# Patient Record
Sex: Male | Born: 1950
Health system: Southern US, Community
[De-identification: ages and names within clinical notes are randomized; demographics above are authoritative.]

## PROBLEM LIST (undated history)

## (undated) DIAGNOSIS — J029 Acute pharyngitis, unspecified: Secondary | ICD-10-CM

## (undated) DIAGNOSIS — J40 Bronchitis, not specified as acute or chronic: Secondary | ICD-10-CM

## (undated) DIAGNOSIS — F329 Major depressive disorder, single episode, unspecified: Secondary | ICD-10-CM

## (undated) DIAGNOSIS — J329 Chronic sinusitis, unspecified: Secondary | ICD-10-CM

## (undated) DIAGNOSIS — J069 Acute upper respiratory infection, unspecified: Secondary | ICD-10-CM

## (undated) DIAGNOSIS — R21 Rash and other nonspecific skin eruption: Secondary | ICD-10-CM

## (undated) DIAGNOSIS — K5790 Diverticulosis of intestine, part unspecified, without perforation or abscess without bleeding: Secondary | ICD-10-CM

## (undated) DIAGNOSIS — E875 Hyperkalemia: Secondary | ICD-10-CM

## (undated) DIAGNOSIS — J310 Chronic rhinitis: Secondary | ICD-10-CM

## (undated) DIAGNOSIS — D492 Neoplasm of unspecified behavior of bone, soft tissue, and skin: Secondary | ICD-10-CM

## (undated) DIAGNOSIS — R109 Unspecified abdominal pain: Secondary | ICD-10-CM

## (undated) DIAGNOSIS — Z6827 Body mass index (BMI) 27.0-27.9, adult: Secondary | ICD-10-CM

## (undated) DIAGNOSIS — L300 Nummular dermatitis: Secondary | ICD-10-CM

## (undated) DIAGNOSIS — E785 Hyperlipidemia, unspecified: Secondary | ICD-10-CM

## (undated) DIAGNOSIS — T7840XA Allergy, unspecified, initial encounter: Secondary | ICD-10-CM

## (undated) DIAGNOSIS — I10 Essential (primary) hypertension: Secondary | ICD-10-CM

## (undated) DIAGNOSIS — A084 Viral intestinal infection, unspecified: Secondary | ICD-10-CM

## (undated) DIAGNOSIS — L409 Psoriasis, unspecified: Secondary | ICD-10-CM

## (undated) DIAGNOSIS — I2510 Atherosclerotic heart disease of native coronary artery without angina pectoris: Secondary | ICD-10-CM

## (undated) DIAGNOSIS — R001 Bradycardia, unspecified: Secondary | ICD-10-CM

## (undated) DIAGNOSIS — E781 Pure hyperglyceridemia: Secondary | ICD-10-CM

## (undated) DIAGNOSIS — J309 Allergic rhinitis, unspecified: Secondary | ICD-10-CM

## (undated) DIAGNOSIS — L42 Pityriasis rosea: Secondary | ICD-10-CM

## (undated) DIAGNOSIS — K921 Melena: Secondary | ICD-10-CM

## (undated) DIAGNOSIS — F419 Anxiety disorder, unspecified: Secondary | ICD-10-CM

## (undated) DIAGNOSIS — E7800 Pure hypercholesterolemia, unspecified: Secondary | ICD-10-CM

## (undated) DIAGNOSIS — R5383 Other fatigue: Secondary | ICD-10-CM

## (undated) DIAGNOSIS — F32A Depression, unspecified: Secondary | ICD-10-CM

## (undated) DIAGNOSIS — K219 Gastro-esophageal reflux disease without esophagitis: Secondary | ICD-10-CM

## (undated) DIAGNOSIS — M1990 Unspecified osteoarthritis, unspecified site: Secondary | ICD-10-CM

## (undated) DIAGNOSIS — B356 Tinea cruris: Secondary | ICD-10-CM

## (undated) DIAGNOSIS — B379 Candidiasis, unspecified: Secondary | ICD-10-CM

## (undated) DIAGNOSIS — R42 Dizziness and giddiness: Secondary | ICD-10-CM

## (undated) DIAGNOSIS — B079 Viral wart, unspecified: Secondary | ICD-10-CM

## (undated) DIAGNOSIS — J189 Pneumonia, unspecified organism: Secondary | ICD-10-CM

## (undated) HISTORY — DX: Pure hyperglyceridemia: E78.1

## (undated) HISTORY — DX: Gastro-esophageal reflux disease without esophagitis: K21.9

## (undated) HISTORY — DX: Anxiety disorder, unspecified: F41.9

## (undated) HISTORY — PX: APPENDECTOMY: SHX54

## (undated) HISTORY — DX: Unspecified osteoarthritis, unspecified site: M19.90

## (undated) HISTORY — DX: Diverticulosis of intestine, part unspecified, without perforation or abscess without bleeding: K57.90

## (undated) HISTORY — DX: Dizziness and giddiness: R42

## (undated) HISTORY — DX: Viral intestinal infection, unspecified: A08.4

## (undated) HISTORY — DX: Unspecified abdominal pain: R10.9

## (undated) HISTORY — PX: COLONOSCOPY: SHX174

## (undated) HISTORY — DX: Viral wart, unspecified: B07.9

## (undated) HISTORY — DX: Hyperkalemia: E87.5

## (undated) HISTORY — DX: Other fatigue: R53.83

## (undated) HISTORY — DX: Tinea cruris: B35.6

## (undated) HISTORY — DX: Candidiasis, unspecified: B37.9

## (undated) HISTORY — DX: Psoriasis, unspecified: L40.9

## (undated) HISTORY — DX: Essential (primary) hypertension: I10

## (undated) HISTORY — DX: Depression, unspecified: F32.A

## (undated) HISTORY — DX: Acute upper respiratory infection, unspecified: J06.9

## (undated) HISTORY — DX: Nummular dermatitis: L30.0

## (undated) HISTORY — DX: Chronic sinusitis, unspecified: J32.9

## (undated) HISTORY — DX: Major depressive disorder, single episode, unspecified: F32.9

## (undated) HISTORY — PX: PENILE PROSTHESIS PLACEMENT: SHX739

## (undated) HISTORY — PX: NASAL SINUS SURGERY: SHX719

## (undated) HISTORY — PX: HERNIA REPAIR: SHX51

## (undated) HISTORY — DX: Body mass index (BMI) 27.0-27.9, adult: Z68.27

## (undated) HISTORY — DX: Pure hypercholesterolemia, unspecified: E78.00

## (undated) HISTORY — PX: EYE SURGERY: SHX253

## (undated) HISTORY — DX: Rash and other nonspecific skin eruption: R21

## (undated) HISTORY — DX: Acute pharyngitis, unspecified: J02.9

## (undated) HISTORY — DX: Allergic rhinitis, unspecified: J30.9

## (undated) HISTORY — DX: Atherosclerotic heart disease of native coronary artery without angina pectoris: I25.10

## (undated) HISTORY — DX: Neoplasm of unspecified behavior of bone, soft tissue, and skin: D49.2

## (undated) HISTORY — DX: Pityriasis rosea: L42

## (undated) HISTORY — DX: Bradycardia, unspecified: R00.1

## (undated) HISTORY — DX: Chronic rhinitis: J31.0

## (undated) HISTORY — PX: CARDIAC CATHETERIZATION: SHX172

## (undated) HISTORY — DX: Melena: K92.1

## (undated) HISTORY — DX: Hyperlipidemia, unspecified: E78.5

## (undated) HISTORY — DX: Bronchitis, not specified as acute or chronic: J40

## (undated) HISTORY — PX: ESOPHAGOGASTRODUODENOSCOPY: SHX1529

---

## 2004-09-11 DIAGNOSIS — K5790 Diverticulosis of intestine, part unspecified, without perforation or abscess without bleeding: Secondary | ICD-10-CM

## 2004-09-11 HISTORY — DX: Diverticulosis of intestine, part unspecified, without perforation or abscess without bleeding: K57.90

## 2007-09-24 ENCOUNTER — Ambulatory Visit: Payer: Self-pay | Admitting: Cardiology

## 2007-09-25 ENCOUNTER — Ambulatory Visit: Payer: Self-pay | Admitting: Cardiology

## 2007-09-25 ENCOUNTER — Inpatient Hospital Stay (HOSPITAL_COMMUNITY): Admission: AD | Admit: 2007-09-25 | Discharge: 2007-09-26 | Payer: Self-pay | Admitting: Cardiology

## 2007-09-26 ENCOUNTER — Encounter: Payer: Self-pay | Admitting: Cardiology

## 2007-10-09 ENCOUNTER — Ambulatory Visit: Payer: Self-pay | Admitting: Cardiology

## 2007-12-03 ENCOUNTER — Encounter: Payer: Self-pay | Admitting: Cardiology

## 2008-09-14 ENCOUNTER — Encounter: Payer: Self-pay | Admitting: Cardiology

## 2008-10-23 ENCOUNTER — Ambulatory Visit: Payer: Self-pay | Admitting: Cardiology

## 2008-12-07 ENCOUNTER — Encounter: Payer: Self-pay | Admitting: Cardiology

## 2008-12-10 ENCOUNTER — Encounter: Payer: Self-pay | Admitting: Cardiology

## 2009-06-23 DIAGNOSIS — E785 Hyperlipidemia, unspecified: Secondary | ICD-10-CM | POA: Insufficient documentation

## 2009-06-23 DIAGNOSIS — E781 Pure hyperglyceridemia: Secondary | ICD-10-CM

## 2009-06-23 DIAGNOSIS — I498 Other specified cardiac arrhythmias: Secondary | ICD-10-CM

## 2009-06-23 DIAGNOSIS — I10 Essential (primary) hypertension: Secondary | ICD-10-CM

## 2009-12-01 ENCOUNTER — Encounter: Payer: Self-pay | Admitting: Cardiology

## 2009-12-02 ENCOUNTER — Encounter: Payer: Self-pay | Admitting: Cardiology

## 2010-02-11 ENCOUNTER — Encounter: Payer: Self-pay | Admitting: Cardiology

## 2010-02-14 ENCOUNTER — Ambulatory Visit: Payer: Self-pay | Admitting: Cardiology

## 2010-02-22 ENCOUNTER — Encounter: Payer: Self-pay | Admitting: Cardiology

## 2010-02-23 ENCOUNTER — Encounter (INDEPENDENT_AMBULATORY_CARE_PROVIDER_SITE_OTHER): Payer: Self-pay | Admitting: *Deleted

## 2010-10-11 NOTE — Letter (Signed)
Summary: Appointment -missed  [facility] [address]   [place], [place] [address]   Phone: [phone]  Fax: [phone]     December 02, 2009 MRN: [id number]     Clayton Braun [address] [place], [place]  [address]     Dear Clayton Braun,  Our records indicate you missed your appointment on December 02, 2009                        with Dr. Myrtis Ser.   It is very important that we reach you to reschedule this appointment. We look forward to participating in your health care needs.   Please contact us at the number listed above at your earliest convenience to reschedule this appointment.   Sincerely,    Glass blower/designer

## 2010-10-11 NOTE — Letter (Signed)
Summary: Engineer, materials at [facility]  [address]   [place], [place] [address]   Phone: [phone]  Fax: [phone]        February 23, 2010 MRN: [id number]    Clayton Braun [address] [place], [place]  [address]    Dear Mr. SENNETT,  Your test ordered by Selena Batten has been reviewed by your physician (or physician assistant) and was found to be normal or stable. Your physician (or physician assistant) felt no changes were needed at this time.  ____ Echocardiogram  ____ Cardiac Stress Test  __X__ Lab Work-Continue current medication regimen. Per Dr. Myrtis Ser, "much better."  ____ Peripheral vascular study of arms, legs or neck  ____ CT scan or X-ray  ____ Lung or Breathing test  ____ Other:   Thank you.   Cyril Loosen, RN, BSN    Duane Boston, M.D., F.A.C.C. Thressa Sheller, M.D., F.A.C.C. Oneal Grout, M.D., F.A.C.C. Cheree Ditto, M.D., F.A.C.C. Daiva Nakayama, M.D., F.A.C.C. Kenney Houseman, M.D., F.A.C.C. Jeanne Ivan, PA-C

## 2010-10-11 NOTE — Miscellaneous (Signed)
  Clinical Lists Changes  Observations: Added new observation of PAST MED HX: CAD...stent RCA..2002...  /  cath  2009...stent patent...residual 50% LAD HYPERTRIGLYCERIDEMIA (ICD-272.1) BRADYCARDIA (ICD-427.89)...sinus HYPERTENSION HYPERLIPIDEMIA depression (02/11/2010 18:13) Added new observation of PRIMARY MD: Vyas (02/11/2010 18:13)       Past History:  Past Medical History: CAD...stent RCA..2002...  /  cath  2009...stent patent...residual 50% LAD HYPERTRIGLYCERIDEMIA (ICD-272.1) BRADYCARDIA (ICD-427.89)...sinus HYPERTENSION HYPERLIPIDEMIA depression

## 2010-10-11 NOTE — Assessment & Plan Note (Signed)
Summary: 1 yr fu-missed last appt.vs   Visit Type:  Follow-up Primary Provider:  Dhruv Vyas,MD  CC:  CAD.  History of Present Illness: Patient is seen for followup of coronary artery disease.  I saw him last February, 2010.  Is not having any chest pain.  He had a stent to his right coronary artery in 2002.  Catheterization 2009 showed that the stent was patent and there was a residual 50% LAD lesion.  He is not eat red meat.  His weight is under good control.  He recently had some labs that show that his triglycerides are elevated despite his meds.  Historically his meds have controlled the triglycerides.  After further discussion he relates that he had had a fishing weekend just prior to the labs and had consumed a large amount of alcohol.  Regular alcohol use is not a problem but it probably infected these labs.  Preventive Screening-Counseling & Management  Alcohol-Tobacco     Smoking Status: quit     Year Quit: 12/2009  Current Medications (verified): 1)  Alprazolam 0.25 Mg Tabs (Alprazolam) .... Take 1 Tablet By Mouth Once A Day 2)  Aspirin Low Strength 81 Mg Chew (Aspirin) .... Take 1 Tablet By Mouth Once A Day 3)  Atenolol 25 Mg Tabs (Atenolol) .... Take 1 Tablet By Mouth Once A Day 4)  Gemfibrozil 600 Mg Tabs (Gemfibrozil) .... Take 1 Tablet By Mouth Twice A Day 5)  Lipitor 10 Mg Tabs (Atorvastatin Calcium) .... Take 1 Tablet By Mouth At Bedtime 6)  Nexium 40 Mg Cpdr (Esomeprazole Magnesium) .... Take 1 Capsule By Mouth Once A Day 7)  Paroxetine Hcl 20 Mg Tabs (Paroxetine Hcl) .... Take 1 Tablet By Mouth Once A Day 8)  Ramipril 2.5 Mg Caps (Ramipril) .... Take 1 Capsule By Mouth Once A Day  Allergies (verified): No Known Drug Allergies  Comments:  Nurse/Medical Assistant: The patient is currently on medications but does not know the name or dosage at this time. Instructed to contact our office with details. Will update medication list at that time.  Past History:  Past  Medical History: CAD...stent RCA..2002...  /  cath  2009...stent patent...residual 50% LAD HYPERTRIGLYCERIDEMIA (ICD-272.1) BRADYCARDIA (ICD-427.89)...sinus HYPERTENSION HYPERLIPIDEMIA depression..  Social History: Smoking Status:  quit  Review of Systems       Patient denies fever, chills, headache, sweats, rash, change in vision, change in hearing, chest pain, cough, nausea vomiting, urinary symptoms.  All other systems are reviewed and are negative.  Vital Signs:  Patient profile:   60 year old male Height:      66 inches Weight:      161 pounds BMI:     26.08 Pulse rate:   52 / minute BP sitting:   101 / 67  (left arm) Cuff size:   regular  Vitals Entered By: Carlye Grippe (February 14, 2010 10:59 AM)  Nutrition Counseling: Patient's BMI is greater than 25 and therefore counseled on weight management options.  Physical Exam  General:  patient is stable in general. Head:  head is atraumatic. Eyes:  no xanthelasma. Neck:  no jugular venous distention.  No carotid bruits. Chest Wall:  no chest wall tenderness. Lungs:  lungs are clear.  Respiratory effort is nonlabored. Heart:  cardiac exam reveals S1-S2.  No clicks or significant murmurs. Abdomen:  abdomen soft. Msk:  no musculoskeletal deformities. Extremities:  no peripheral edema. Skin:  no skin rashes. Psych:  patient is oriented to person time and  place.  Affect is normal.   Impression & Recommendations:  Problem # 1:  CAD, NATIVE VESSEL (ICD-414.01)  His updated medication list for this problem includes:    Aspirin Low Strength 81 Mg Chew (Aspirin) .Marland Kitchen... Take 1 tablet by mouth once a day    Atenolol 25 Mg Tabs (Atenolol) .Marland Kitchen... Take 1 tablet by mouth once a day    Ramipril 2.5 Mg Caps (Ramipril) .Marland Kitchen... Take 1 capsule by mouth once a day  Orders: EKG w/ Interpretation (93000) Coronary disease is stable.  EKG is done today and reviewed by me.  He has sinus bradycardia.  No change in therapy.  Problem # 2:   HYPERTRIGLYCERIDEMIA (ICD-272.1)  His updated medication list for this problem includes:    Gemfibrozil 600 Mg Tabs (Gemfibrozil) .Marland Kitchen... Take 1 tablet by mouth twice a day    Lipitor 10 Mg Tabs (Atorvastatin calcium) .Marland Kitchen... Take 1 tablet by mouth at bedtime I have looked back at his labs over the years.  On his medications his triglycerides are usually controlled.  Most recent labs showed that his triglycerides were elevated.  After that review it became clear that he had had heavy alcohol intake on a fishing trip the weekend before the labs.  It does not appear that alcohol is an ongoing issue but it didn't affect his labs.  Repeat fasting lipid profile was appropriate to be sure.  Problem # 3:  HYPERTENSION, UNSPECIFIED (ICD-401.9)  His updated medication list for this problem includes:    Aspirin Low Strength 81 Mg Chew (Aspirin) .Marland Kitchen... Take 1 tablet by mouth once a day    Atenolol 25 Mg Tabs (Atenolol) .Marland Kitchen... Take 1 tablet by mouth once a day    Ramipril 2.5 Mg Caps (Ramipril) .Marland Kitchen... Take 1 capsule by mouth once a day Blood pressure is controlled.  No change in therapy.  Problem # 4:  BRADYCARDIA (ICD-427.89)  His updated medication list for this problem includes:    Aspirin Low Strength 81 Mg Chew (Aspirin) .Marland Kitchen... Take 1 tablet by mouth once a day    Atenolol 25 Mg Tabs (Atenolol) .Marland Kitchen... Take 1 tablet by mouth once a day    Ramipril 2.5 Mg Caps (Ramipril) .Marland Kitchen... Take 1 capsule by mouth once a day Patient has bradycardia but has no symptoms.  No change in therapy.  Other Orders: T-Lipid Profile [phone]) T-Hepatic Function [phone])  Patient Instructions: 1)  Your physician wants you to follow-up in: 1 year. You will receive a reminder letter in the mail one-two months in advance. If you don't receive a letter, please call our office to schedule the follow-up appointment. 2)  Your physician recommends that you continue on your current medications as directed. Please refer to the  Current Medication list given to you today. 3)  Your physician recommends that you go to the [facility] for a FASTING lipid profile and liver function labs. Do not eat or drink after midnight. If the results of your test are normal or stable, you will receive a letter. If they are abnormal, the nurse will contact you by phone.

## 2010-10-11 NOTE — Miscellaneous (Signed)
  Clinical Lists Changes  Observations: Added new observation of PAST MED HX: CAD...stent RCA..2002...  /  cath  2009...stent patent...residual 50% LAD HYPERTRIGLYCERIDEMIA (ICD-272.1) BRADYCARDIA (ICD-427.89)...sinus HYPERTENSION, UNSPECIFIED (ICD-401.9) HYPERLIPIDEMIA-MIXED (ICD-272.4) depression (12/01/2009 12:15) Added new observation of PRIMARY MD: Vyas (12/01/2009 12:15)       Past History:  Past Medical History: CAD...stent RCA..2002...  /  cath  2009...stent patent...residual 50% LAD HYPERTRIGLYCERIDEMIA (ICD-272.1) BRADYCARDIA (ICD-427.89)...sinus HYPERTENSION, UNSPECIFIED (ICD-401.9) HYPERLIPIDEMIA-MIXED (ICD-272.4) depression

## 2011-01-24 NOTE — Assessment & Plan Note (Signed)
[facility]                          [facility] OFFICE NOTE   ISIDORE, MARGRAF                       MRN:          [id number]  DATE:10/23/2008                            DOB:          [birth year]    Mr. Comer is seen for the followup of his coronary artery disease.  He  is actually doing well.  He has known disease.  His last cath was done  in January 2009.  Stent to his right coronary artery was patent.  D-  dimer was normal.  He has had no significant chest discomfort.  He is  taking his medicines.   PAST MEDICAL HISTORY:   ALLERGIES:  No known drug allergies.   MEDICATIONS:  See the flow sheet.   REVIEW OF SYSTEMS:  He has no GI or GU symptoms.  He has no headaches.  There is no fevers or chills.  His review of systems is negative.   PHYSICAL EXAMINATION:  VITAL SIGNS:  Blood pressure is 110/70 with a  pulse of 50.  GENERAL:  The patient is oriented to person, time, and place.  Affect is  normal.  HEENT:  No xanthelasma.  He has normal extraocular motion.  NECK:  There are no carotid bruits.  There is no jugular venous  distention.  LUNGS:  Clear.  Respiratory effort is not labored.  CARDIAC:  S1 with an S2.  There are no clicks or significant murmurs.  ABDOMEN:  Soft.  EXTREMITIES:  He has no peripheral edema.   EKG reveals no significant abnormalities.   PROBLEMS:  #1.  Hypertension treated.  #2.  Hyperlipidemia.  LDL recently was 113.  HDL was up to 46.  Triglycerides were down to 118.  I have suggested that his Lipitor will  be increased to 20 mg daily.  His LDL goal should be in the range of 70.  #3.  Chronic sinus bradycardia, stated stable.  #4.  History of tobacco use and he has stopped completely.  #5.  History of depression, stable.  #6.  Hypertriglyceridemia.  This is now nicely treated.  #7.  Status post appendectomy and hernia repair.  #8.  Coronary artery disease.  He had a stent to his right coronary  artery in  2002.  Catheterization in 2009 showed 50% left anterior  descending residual stenosis and a patent  stent in his right coronary artery.  He is stable.  He needs no further  workup.  I will see him back in a year.  He will check with Dr. Sherril Croon  about pushing his Lipitor up to 20.     Luis Abed, MD, [facility]  Electronically Signed    JDK/MedQ  DD: 10/23/2008  DT: 10/24/2008  Job #: [id number]   cc:   Doreen Beam, MD

## 2011-01-24 NOTE — Assessment & Plan Note (Signed)
[facility]                          [facility] OFFICE NOTE   [name], [name]                       MRN:          [id number]  DATE:10/09/2007                            DOB:          1950-10-19    Clayton Braun is seen for post hospital follow-up.  He had been seen at  [facility] and because he had proven coronary disease before and he had  presenting symptoms.  He was sent to [facility] for follow-up cath.  The study  was done by Dr. Juanda Chance.  The cath was done September 26, 2007.  He had a  50% LAD lesion.  The stent in his right coronary artery had only 20%  stenosis.  D-dimer was normal.  It was felt that his symptoms were not  cardiac and not related to pulmonary embolism.  He was discharged home  and he is doing well.  He is increase his activities back to normal.   PAST MEDICAL HISTORY:   ALLERGIES:  No known drug allergies.   MEDICATIONS:  Gemfibrozil, Lipitor, alprazolam, paroxetine,  multivitamin, aspirin, atenolol, Nexium, and ramipril.   OTHER MEDICAL PROBLEMS:  See the list below.   REVIEW OF SYSTEMS:  He is feeling well today.  He is not having any  significant problems.   His review of systems otherwise is negative.   PHYSICAL EXAM:  Today he is quite stable.  Blood pressure is 116/67 with a pulse of 76.  The patient is oriented to person, time and place in his affect is  normal.  HEENT:  Reveals no xanthelasma.  He has normal extraocular motion.  There are no carotid bruits.  There is no jugular venous distention.  Lungs are clear.  Respiratory effort is not labored.  Cardiac exam reveals S1-S2.  There are no clicks or significant murmurs.  His abdomen is soft.  His cath site is nicely healed.  He has no peripheral edema.   EKG reviewed is normal.   PROBLEMS:  1. Hypertension treated.  2. Hyperlipidemia treated.  It is important that his LDL be kept in      the 70 range.  3. Chronic sinus bradycardia.  4. Ongoing tobacco  abuse and we have continued to educate him and      encourage him to stop completely.  5. History of some depression.  6. Hypertriglyceridemia on medication.  7. Status post appendectomy and hernia repair.  8. Coronary disease post PCI with a stent to the right coronary artery      in 2002 and recent cath as outlined above with residual 50%      stenosis at this time of his LAD.  His right coronary stent is      stable.   Clayton Braun is doing well.  I had a good discussion with him about taking  his medications and careful follow-up.  I will see him back in 1 year.  If he has any return of significant symptoms will be happy to see him  sooner.  He is stable.     Clayton Abed, MD, [facility]  Electronically  Signed    JDK/MedQ  DD: 10/09/2007  DT: 10/09/2007  Job #: [id number]   cc:   Clayton Beam, MD

## 2011-01-24 NOTE — Discharge Summary (Signed)
NAMEJERMARION, Clayton Braun                ACCOUNT NO.:  [id number]   MEDICAL RECORD NO.:  [id number]          PATIENT TYPE:  INP   LOCATION:  2021                         FACILITY:  [facility]   PHYSICIAN:  Everardo Beals. Juanda Chance, MD, FACCDATE OF BIRTH:  June 21, 1951   DATE OF ADMISSION:  09/25/2007  DATE OF DISCHARGE:  09/26/2007                               DISCHARGE SUMMARY   PRIMARY CARDIOLOGIST:  Luis Abed, MD, [facility] in [place].   PRIMARY CARE PHYSICIAN:  Doreen Beam, M.D. also in [place].   DISCHARGE DIAGNOSIS:  Chest pain.   SECONDARY DIAGNOSES:  1. Coronary artery disease, status post percutaneous coronary      intervention and bare metal stenting of the right coronary artery      in 2002.  2. Hypertension.  3. Hyperlipidemia.  4. Chronic sinus bradycardia.  5. Ongoing tobacco abuse at a pack a day.  6. Gastroesophageal reflux disease.  7. Depression.  8. Hypertriglyceridemia.  9. Status post appendectomy.  10.Status post hernia repair.   ALLERGIES:  No known drug allergies.   PROCEDURES:  Left heart cardiac catheterization.   HISTORY OF PRESENT ILLNESS:  A 60 year old male with a prior history of  single vessel CAD status post bare metal stenting of the right coronary  artery at [facility] in [place] in 2002.  He  was directly admitted to [facility] on September 24, 2007,  secondary to a several week history of a mid chest burning sensation as  well as episodic hot flashes without any frank chest pain, tightness, or  dyspnea.  The symptoms he presented with however were reminiscent of  prior angina thus prompting admission.  He was evaluated by [facility]iology at [facility] and the decision was made to transfer him to  [facility] for further evaluation.   HOSPITAL COURSE:  [name] ruled out for MI.  He underwent left heart  cardiac catheterization on January 15th, revealing a patent stent in the  proximal RCA and otherwise nonobstructive  disease.  EF was 60% without  regional wall motion abnormalities.  We will plan to discharge the  patient today as he has been ambulating without recurrent discomfort.  We have increased his Nexium to 40 mg b.i.d.   DISCHARGE LABORATORY:  Total cholesterol 117, triglycerides 191, HDL 24,  LDL 55.  Of note cardiac markers were negative at [facility].   DISPOSITION:  The patient is being discharged home today in good  condition.   FOLLOWUP PLANS/APPOINTMENTS:  1. We arranged for followup with Dr. Willa Rough on January 28th at      12:30 p.m.  2. He is to follow up with Dr. Sherril Croon as scheduled.   DISCHARGE MEDICATIONS:  1. Lopid 600 mg b.i.d.  2. B12 vitamin b.i.d.  3. Altace 2.5 mg daily.  4. Atenolol 25 mg daily.  5. Aspirin 81 mg daily.  6. Lipitor 10 mg daily.  7. Paxil 20 mg daily.  8. Foltx tab 1 daily.  9. Xanax 0.5 mg q.h.s.  10.Nexium 40 mg b.i.d.  11.Nitroglycerin 0.4 mg sublingual p.r.n. chest  pain.   OUTSTANDING LABORATORY STUDIES:  None.   DURATION OF DISCHARGE ENCOUNTER:  Forty minutes including physician  time.      Nicolasa Ducking, ANP      Bruce R. Juanda Chance, MD, [facility]  Electronically Signed    CB/MEDQ  D:  09/26/2007  T:  09/26/2007  Job:  [id number]   cc:   Doreen Beam, MD

## 2011-01-24 NOTE — Cardiovascular Report (Signed)
Clayton Braun, Clayton Braun                ACCOUNT NO.:  [id number]   MEDICAL RECORD NO.:  [id number]          PATIENT TYPE:  INP   LOCATION:  2021                         FACILITY:  [facility]   PHYSICIAN:  Everardo Beals. Juanda Chance, MD, FACCDATE OF BIRTH:  June 22, 1951   DATE OF PROCEDURE:  09/26/2007  DATE OF DISCHARGE:                            CARDIAC CATHETERIZATION   PRIMARY CARE PHYSICIAN:  Dr. Lillie Columbia.   PRIMARY CARDIOLOGIST:  Dr. Myrtis Ser in [place].   CLINICAL HISTORY:  The patient is a 60 year old and has a history of  bare metal stent placed in 2002 by Dr. Dorena Cookey at [facility].  He  developed chest pain yesterday and was seen in Dr. Reino Bellis and  transferred to Dr. Andee Lineman and transferred to [facility] was seen  by Dr. Andee Lineman again.  His symptoms were felt consistent with unstable  angina and was transferred to [facility] for further evaluation.  His  markers were negative.  He continues to smoke.  Does have a history of  reflux disease.   PROCEDURE:  Procedure was performed via the femoral  artery using an  arterial sheath and 5-French preformed coronary catheters. A front wall  arterial puncture was performed and Omnipaque contrast was used.  The  patient tolerated the procedure well and left the laboratory in  satisfactory condition.   RESULTS:  Left main coronary artery:  The left main coronary  artery is  free of significant disease.   Left anterior descending artery:  Left anterior descending artery gave  rise to 3 diagonal branches, septal perforator.  The LAD was small in  caliber and irregular.  There was 50% narrowing in the proximal to mid-  vessel.   Circumflex artery:  The circumflex artery gave rise to a marginal branch  and a posterolateral branch.  This vessel is also small and irregular  but free of major obstruction.   The right coronary artery was a moderate-sized vessel that gave rise to  posterior descending branch and 2 posterolateral branches.  There  is a  focal 20% narrowing within the stent in the very proximal portion of the  right coronary artery.  There are irregularities in the rest of the  vessel but no significant obstruction.   The left ventriculogram performed in the RAO projection showed good wall  motion with no areas of hypokinesis.  The estimated fraction is 60%.   Aortic pressure was 108/65 with a mean of 83 and left ventricle pressure  108/14.   CONCLUSION:  Nonobstructive coronary artery disease with 50% narrowing  in the proximal LAD, no significant obstruction of circumflex artery,  20% narrowing at the stent in the proximal right coronary and normal LV  function.   RECOMMENDATIONS:  Reassurance.  In view of these findings, his symptoms  may be related to reflux.  Will plan to get a D-dimer and to rule out  the remote possibility of pulmonary embolism.  If this is negative we  will treat him for reflux and see if there is recurrence of symptoms.  Will plan discharge later in the day.  Bruce Elvera Lennox Juanda Chance, MD, [facility]  Electronically Signed     BRB/MEDQ  D:  09/26/2007  T:  09/26/2007  Job:  [id number]   cc:   Doreen Beam, MD

## 2011-03-16 ENCOUNTER — Encounter: Payer: Self-pay | Admitting: Cardiology

## 2011-06-01 LAB — D-DIMER, QUANTITATIVE: D-Dimer, Quant: <0.22

## 2011-06-01 LAB — LIPID PANEL
Cholesterol: 117
LDL Cholesterol: 55
Total CHOL/HDL Ratio: 4.9
VLDL: 38

## 2011-12-06 ENCOUNTER — Encounter: Payer: Self-pay | Admitting: Cardiology

## 2011-12-06 DIAGNOSIS — I2510 Atherosclerotic heart disease of native coronary artery without angina pectoris: Secondary | ICD-10-CM | POA: Insufficient documentation

## 2011-12-07 ENCOUNTER — Encounter: Payer: Self-pay | Admitting: Cardiology

## 2011-12-07 ENCOUNTER — Telehealth: Payer: Self-pay | Admitting: *Deleted

## 2011-12-07 ENCOUNTER — Other Ambulatory Visit: Payer: Self-pay | Admitting: Cardiology

## 2011-12-07 ENCOUNTER — Ambulatory Visit (INDEPENDENT_AMBULATORY_CARE_PROVIDER_SITE_OTHER): Payer: BC Managed Care – PPO | Admitting: Cardiology

## 2011-12-07 ENCOUNTER — Encounter: Payer: Self-pay | Admitting: *Deleted

## 2011-12-07 VITALS — BP 107/63 | HR 66 | Ht 67.0 in | Wt 169.0 lb

## 2011-12-07 DIAGNOSIS — E875 Hyperkalemia: Secondary | ICD-10-CM

## 2011-12-07 DIAGNOSIS — I498 Other specified cardiac arrhythmias: Secondary | ICD-10-CM

## 2011-12-07 DIAGNOSIS — I10 Essential (primary) hypertension: Secondary | ICD-10-CM

## 2011-12-07 DIAGNOSIS — R079 Chest pain, unspecified: Secondary | ICD-10-CM

## 2011-12-07 DIAGNOSIS — I2510 Atherosclerotic heart disease of native coronary artery without angina pectoris: Secondary | ICD-10-CM

## 2011-12-07 DIAGNOSIS — E781 Pure hyperglyceridemia: Secondary | ICD-10-CM

## 2011-12-07 NOTE — Patient Instructions (Signed)
Your physician you to follow up in 1 year. You will receive a reminder letter in the mail one-two months in advance. If you don't receive a letter, please call our office to schedule the follow-up appointment. Your physician recommends that you continue on your current medications as directed. Please refer to the Current Medication list given to you today. Your physician recommends that you go to the [facility] for lab work: [employer]. Your physician has requested that you have an exercise stress myoview. For further information please visit [email or web address] Please follow instruction sheet, as given. If the results of your test are normal or stable, you will receive a letter. If they are abnormal, the nurse will contact you by phone.

## 2011-12-07 NOTE — Assessment & Plan Note (Signed)
His triglycerides are being treated. No change in therapy.

## 2011-12-07 NOTE — Assessment & Plan Note (Signed)
It is not clear if the patient's current chest pain could be related to his cardiac status or not. His last study was catheterization in 2009 and he did have a 50% residual LAD lesion. We will proceed with a stress nuclear scan for follow valuation.

## 2011-12-07 NOTE — Assessment & Plan Note (Signed)
The patient's last potassium was a month ago according to him. He is very concerned that it may be even higher than the past. I have agreed to order a potassium level on him today along with his renal function and send a copy to his primary physician. It is of note that he is on a very small dose of Ramapril. It is possible that this could play some role with elevating his potassium.

## 2011-12-07 NOTE — Telephone Encounter (Signed)
Pt has [employer] of [place].  Per Seabron Spates, no precert required.

## 2011-12-07 NOTE — Progress Notes (Signed)
HPI Patient is seen today to followup coronary disease and to followup recent chest pain. The patient has known coronary disease. I saw him last in the office in June, 2011. He underwent a stent to the right coronary artery in 2002. Catheterization in 2009 revealed that the stent was patent. He did have residual 50% LAD disease. Recently he's had some chest discomfort. He did fall forward into some stairs. He had some pain from this. There was no syncope. He tells me that he was evaluated and had no fractured ribs. However he's had some vague continued chest pain and it is not clear if this is ischemia or not.  In addition he tells me that his potassium has been high when checked by Dr. Sherril Croon. He has been referred to nephrology for this.  As part of today's evaluation I have reviewed all of the patient's old cardiac history. I have carefully updated the new electronic medical record.  Allergies  Allergen Reactions  . Niacin And Related Other (See Comments)    flushing    Current Outpatient Prescriptions  Medication Sig Dispense Refill  . ALPRAZolam (XANAX) 0.25 MG tablet Take 0.25 mg by mouth daily.        Marland Kitchen aspirin 81 MG chewable tablet Chew 81 mg by mouth daily.        Marland Kitchen atenolol (TENORMIN) 25 MG tablet Take 25 mg by mouth daily.        Marland Kitchen atorvastatin (LIPITOR) 10 MG tablet Take 10 mg by mouth at bedtime.        Marland Kitchen esomeprazole (NEXIUM) 40 MG capsule Take 40 mg by mouth daily.        . FOLBIC 2.5-25-2 MG TABS Take 1 tablet by mouth Daily.      Marland Kitchen gemfibrozil (LOPID) 600 MG tablet Take 600 mg by mouth 2 (two) times daily.        . Multiple Vitamins-Minerals (CENTRUM SILVER PO) Take 1 tablet by mouth daily.      Marland Kitchen PARoxetine (PAXIL) 20 MG tablet Take 20 mg by mouth daily.        . ramipril (ALTACE) 2.5 MG capsule Take 2.5 mg by mouth daily.          History   Social History  . Marital Status: Married    Spouse Name: N/A    Number of Children: N/A  . Years of Education: N/A    Occupational History  . Full time [employer]   Social History Main Topics  . Smoking status: Current Everyday Smoker -- 0.2 packs/day for 48 years    Types: Cigarettes  . Smokeless tobacco: Never Used  . Alcohol Use: Yes  . Drug Use: No  . Sexually Active: Not on file   Other Topics Concern  . Not on file   Social History Narrative   No regular exercise.    Family History  Problem Relation Age of Onset  . Lung disease Other   . Heart disease Other     Past Medical History  Diagnosis Date  . Coronary artery disease     Stent to RCA, 2002   /    cath 2009, stent patent, residual 50% LAD  . Bradycardia, sinus   . Hypertension   . Hypertriglyceridemia   . Hyperlipidemia   . Depression   . Chest pain     March, 2013  . Hyperkalemia     Per the patient, 2013    Past Surgical History  Procedure Date  .  Hernia repair   . Appendectomy     ROS  Patient denies fever, chills, headache, sweats, rash, change in vision, change in hearing, cough, nausea vomiting, urinary symptoms. All other systems are reviewed and are negative.  PHYSICAL EXAM Patient is oriented to person time and place. Affect is normal. Head is atraumatic. There is no xanthelasma. There no carotid bruits. Is no jugulovenous distention. Lungs are clear. Respiratory effort is nonlabored. Cardiac exam reveals S1 and S2. There no clicks or significant murmurs. The abdomen is soft. There is no peripheral edema. There no musculoskeletal deformities. There are no skin rashes.  Filed Vitals:   12/07/11 1442  BP: 107/63  Pulse: 66  Height: 5\' 7"  (1.702 m)  Weight: 169 lb (76.658 kg)   EKG is done today and reviewed by me. There is no significant abnormality.  ASSESSMENT & PLAN

## 2011-12-07 NOTE — Assessment & Plan Note (Signed)
Blood pressure is well controlled. No change in therapy. 

## 2011-12-07 NOTE — Telephone Encounter (Signed)
exercise stress myoview scheduled for 12-12-2011 @ [facility]

## 2011-12-07 NOTE — Assessment & Plan Note (Signed)
Because he has known coronary disease with a 50% LAD lesion and a stent in the past, we will proceed with a nuclear stress study.

## 2011-12-07 NOTE — Assessment & Plan Note (Signed)
Rhythm is regular. There is no symptomatic bradycardia. No change in therapy.

## 2011-12-12 DIAGNOSIS — I2510 Atherosclerotic heart disease of native coronary artery without angina pectoris: Secondary | ICD-10-CM

## 2011-12-14 ENCOUNTER — Encounter: Payer: Self-pay | Admitting: *Deleted

## 2011-12-25 ENCOUNTER — Ambulatory Visit: Payer: Self-pay | Admitting: Cardiology

## 2013-08-04 ENCOUNTER — Encounter: Payer: Self-pay | Admitting: Cardiology

## 2015-09-22 DIAGNOSIS — M25512 Pain in left shoulder: Secondary | ICD-10-CM | POA: Diagnosis not present

## 2015-09-22 DIAGNOSIS — M5090 Cervical disc disorder, unspecified, unspecified cervical region: Secondary | ICD-10-CM | POA: Diagnosis not present

## 2015-09-22 DIAGNOSIS — M7552 Bursitis of left shoulder: Secondary | ICD-10-CM | POA: Diagnosis not present

## 2015-09-22 DIAGNOSIS — M5412 Radiculopathy, cervical region: Secondary | ICD-10-CM | POA: Diagnosis not present

## 2015-12-01 DIAGNOSIS — Z299 Encounter for prophylactic measures, unspecified: Secondary | ICD-10-CM | POA: Diagnosis not present

## 2015-12-01 DIAGNOSIS — Z720 Tobacco use: Secondary | ICD-10-CM | POA: Diagnosis not present

## 2015-12-01 DIAGNOSIS — F17210 Nicotine dependence, cigarettes, uncomplicated: Secondary | ICD-10-CM | POA: Diagnosis not present

## 2015-12-01 DIAGNOSIS — M1990 Unspecified osteoarthritis, unspecified site: Secondary | ICD-10-CM | POA: Diagnosis not present

## 2015-12-01 DIAGNOSIS — F419 Anxiety disorder, unspecified: Secondary | ICD-10-CM | POA: Diagnosis not present

## 2015-12-29 DIAGNOSIS — I2510 Atherosclerotic heart disease of native coronary artery without angina pectoris: Secondary | ICD-10-CM | POA: Diagnosis not present

## 2015-12-29 DIAGNOSIS — E7800 Pure hypercholesterolemia, unspecified: Secondary | ICD-10-CM | POA: Diagnosis not present

## 2016-01-14 DIAGNOSIS — I2510 Atherosclerotic heart disease of native coronary artery without angina pectoris: Secondary | ICD-10-CM | POA: Diagnosis not present

## 2016-01-14 DIAGNOSIS — E7800 Pure hypercholesterolemia, unspecified: Secondary | ICD-10-CM | POA: Diagnosis not present

## 2016-01-26 DIAGNOSIS — M7552 Bursitis of left shoulder: Secondary | ICD-10-CM | POA: Diagnosis not present

## 2016-01-26 DIAGNOSIS — M5090 Cervical disc disorder, unspecified, unspecified cervical region: Secondary | ICD-10-CM | POA: Diagnosis not present

## 2016-01-26 DIAGNOSIS — S46812A Strain of other muscles, fascia and tendons at shoulder and upper arm level, left arm, initial encounter: Secondary | ICD-10-CM | POA: Diagnosis not present

## 2016-02-01 DIAGNOSIS — S46812A Strain of other muscles, fascia and tendons at shoulder and upper arm level, left arm, initial encounter: Secondary | ICD-10-CM | POA: Diagnosis not present

## 2016-02-03 DIAGNOSIS — M7502 Adhesive capsulitis of left shoulder: Secondary | ICD-10-CM | POA: Diagnosis not present

## 2016-02-03 DIAGNOSIS — S46812A Strain of other muscles, fascia and tendons at shoulder and upper arm level, left arm, initial encounter: Secondary | ICD-10-CM | POA: Diagnosis not present

## 2016-03-02 DIAGNOSIS — M7502 Adhesive capsulitis of left shoulder: Secondary | ICD-10-CM | POA: Diagnosis not present

## 2016-03-02 DIAGNOSIS — M25512 Pain in left shoulder: Secondary | ICD-10-CM | POA: Diagnosis not present

## 2016-03-06 DIAGNOSIS — M7502 Adhesive capsulitis of left shoulder: Secondary | ICD-10-CM | POA: Diagnosis not present

## 2016-03-06 DIAGNOSIS — M25512 Pain in left shoulder: Secondary | ICD-10-CM | POA: Diagnosis not present

## 2016-03-09 DIAGNOSIS — M25512 Pain in left shoulder: Secondary | ICD-10-CM | POA: Diagnosis not present

## 2016-03-09 DIAGNOSIS — M7502 Adhesive capsulitis of left shoulder: Secondary | ICD-10-CM | POA: Diagnosis not present

## 2016-03-13 DIAGNOSIS — M25512 Pain in left shoulder: Secondary | ICD-10-CM | POA: Diagnosis not present

## 2016-03-13 DIAGNOSIS — M7502 Adhesive capsulitis of left shoulder: Secondary | ICD-10-CM | POA: Diagnosis not present

## 2016-03-16 DIAGNOSIS — M25512 Pain in left shoulder: Secondary | ICD-10-CM | POA: Diagnosis not present

## 2016-03-16 DIAGNOSIS — M7502 Adhesive capsulitis of left shoulder: Secondary | ICD-10-CM | POA: Diagnosis not present

## 2016-03-21 DIAGNOSIS — M25512 Pain in left shoulder: Secondary | ICD-10-CM | POA: Diagnosis not present

## 2016-03-21 DIAGNOSIS — M7502 Adhesive capsulitis of left shoulder: Secondary | ICD-10-CM | POA: Diagnosis not present

## 2016-03-23 DIAGNOSIS — M7502 Adhesive capsulitis of left shoulder: Secondary | ICD-10-CM | POA: Diagnosis not present

## 2016-03-23 DIAGNOSIS — M25512 Pain in left shoulder: Secondary | ICD-10-CM | POA: Diagnosis not present

## 2016-03-28 DIAGNOSIS — M25512 Pain in left shoulder: Secondary | ICD-10-CM | POA: Diagnosis not present

## 2016-03-28 DIAGNOSIS — M7502 Adhesive capsulitis of left shoulder: Secondary | ICD-10-CM | POA: Diagnosis not present

## 2016-03-30 DIAGNOSIS — M25512 Pain in left shoulder: Secondary | ICD-10-CM | POA: Diagnosis not present

## 2016-03-30 DIAGNOSIS — M7502 Adhesive capsulitis of left shoulder: Secondary | ICD-10-CM | POA: Diagnosis not present

## 2016-03-31 DIAGNOSIS — E7800 Pure hypercholesterolemia, unspecified: Secondary | ICD-10-CM | POA: Diagnosis not present

## 2016-03-31 DIAGNOSIS — I2510 Atherosclerotic heart disease of native coronary artery without angina pectoris: Secondary | ICD-10-CM | POA: Diagnosis not present

## 2016-04-03 DIAGNOSIS — R109 Unspecified abdominal pain: Secondary | ICD-10-CM | POA: Diagnosis not present

## 2016-04-03 DIAGNOSIS — K5732 Diverticulitis of large intestine without perforation or abscess without bleeding: Secondary | ICD-10-CM | POA: Diagnosis not present

## 2016-04-03 DIAGNOSIS — Z299 Encounter for prophylactic measures, unspecified: Secondary | ICD-10-CM | POA: Diagnosis not present

## 2016-04-03 DIAGNOSIS — F419 Anxiety disorder, unspecified: Secondary | ICD-10-CM | POA: Diagnosis not present

## 2016-04-03 DIAGNOSIS — L409 Psoriasis, unspecified: Secondary | ICD-10-CM | POA: Diagnosis not present

## 2016-04-04 DIAGNOSIS — M25512 Pain in left shoulder: Secondary | ICD-10-CM | POA: Diagnosis not present

## 2016-04-04 DIAGNOSIS — M7502 Adhesive capsulitis of left shoulder: Secondary | ICD-10-CM | POA: Diagnosis not present

## 2016-04-06 DIAGNOSIS — M7502 Adhesive capsulitis of left shoulder: Secondary | ICD-10-CM | POA: Diagnosis not present

## 2016-04-06 DIAGNOSIS — M25512 Pain in left shoulder: Secondary | ICD-10-CM | POA: Diagnosis not present

## 2016-04-17 DIAGNOSIS — E7800 Pure hypercholesterolemia, unspecified: Secondary | ICD-10-CM | POA: Diagnosis not present

## 2016-04-17 DIAGNOSIS — I2510 Atherosclerotic heart disease of native coronary artery without angina pectoris: Secondary | ICD-10-CM | POA: Diagnosis not present

## 2016-04-25 DIAGNOSIS — Z789 Other specified health status: Secondary | ICD-10-CM | POA: Diagnosis not present

## 2016-04-25 DIAGNOSIS — L821 Other seborrheic keratosis: Secondary | ICD-10-CM | POA: Diagnosis not present

## 2016-04-25 DIAGNOSIS — L400 Psoriasis vulgaris: Secondary | ICD-10-CM | POA: Diagnosis not present

## 2016-04-25 DIAGNOSIS — L820 Inflamed seborrheic keratosis: Secondary | ICD-10-CM | POA: Diagnosis not present

## 2016-04-25 DIAGNOSIS — L578 Other skin changes due to chronic exposure to nonionizing radiation: Secondary | ICD-10-CM | POA: Diagnosis not present

## 2016-05-11 DIAGNOSIS — R05 Cough: Secondary | ICD-10-CM | POA: Diagnosis not present

## 2016-05-11 DIAGNOSIS — J22 Unspecified acute lower respiratory infection: Secondary | ICD-10-CM | POA: Diagnosis not present

## 2016-05-21 DIAGNOSIS — J209 Acute bronchitis, unspecified: Secondary | ICD-10-CM | POA: Diagnosis not present

## 2016-05-21 DIAGNOSIS — Z881 Allergy status to other antibiotic agents status: Secondary | ICD-10-CM | POA: Diagnosis not present

## 2016-06-05 DIAGNOSIS — R05 Cough: Secondary | ICD-10-CM | POA: Diagnosis not present

## 2016-06-05 DIAGNOSIS — R0989 Other specified symptoms and signs involving the circulatory and respiratory systems: Secondary | ICD-10-CM | POA: Diagnosis not present

## 2016-06-05 DIAGNOSIS — J209 Acute bronchitis, unspecified: Secondary | ICD-10-CM | POA: Diagnosis not present

## 2016-06-05 DIAGNOSIS — R0602 Shortness of breath: Secondary | ICD-10-CM | POA: Diagnosis not present

## 2016-06-05 DIAGNOSIS — J40 Bronchitis, not specified as acute or chronic: Secondary | ICD-10-CM | POA: Diagnosis not present

## 2016-06-05 DIAGNOSIS — Z87891 Personal history of nicotine dependence: Secondary | ICD-10-CM | POA: Diagnosis not present

## 2016-06-13 DIAGNOSIS — F17200 Nicotine dependence, unspecified, uncomplicated: Secondary | ICD-10-CM | POA: Diagnosis not present

## 2016-06-13 DIAGNOSIS — Z299 Encounter for prophylactic measures, unspecified: Secondary | ICD-10-CM | POA: Diagnosis not present

## 2016-06-13 DIAGNOSIS — Z720 Tobacco use: Secondary | ICD-10-CM | POA: Diagnosis not present

## 2016-06-13 DIAGNOSIS — J329 Chronic sinusitis, unspecified: Secondary | ICD-10-CM | POA: Diagnosis not present

## 2016-06-13 DIAGNOSIS — Z6827 Body mass index (BMI) 27.0-27.9, adult: Secondary | ICD-10-CM | POA: Diagnosis not present

## 2016-06-13 DIAGNOSIS — Z713 Dietary counseling and surveillance: Secondary | ICD-10-CM | POA: Diagnosis not present

## 2016-08-04 DIAGNOSIS — Z299 Encounter for prophylactic measures, unspecified: Secondary | ICD-10-CM | POA: Diagnosis not present

## 2016-08-04 DIAGNOSIS — J029 Acute pharyngitis, unspecified: Secondary | ICD-10-CM | POA: Diagnosis not present

## 2016-08-17 DIAGNOSIS — E7800 Pure hypercholesterolemia, unspecified: Secondary | ICD-10-CM | POA: Diagnosis not present

## 2016-08-17 DIAGNOSIS — Z6827 Body mass index (BMI) 27.0-27.9, adult: Secondary | ICD-10-CM | POA: Diagnosis not present

## 2016-08-17 DIAGNOSIS — J069 Acute upper respiratory infection, unspecified: Secondary | ICD-10-CM | POA: Diagnosis not present

## 2016-08-17 DIAGNOSIS — Z713 Dietary counseling and surveillance: Secondary | ICD-10-CM | POA: Diagnosis not present

## 2016-08-17 DIAGNOSIS — Z299 Encounter for prophylactic measures, unspecified: Secondary | ICD-10-CM | POA: Diagnosis not present

## 2016-09-14 DIAGNOSIS — Z1389 Encounter for screening for other disorder: Secondary | ICD-10-CM | POA: Diagnosis not present

## 2016-09-14 DIAGNOSIS — Z79899 Other long term (current) drug therapy: Secondary | ICD-10-CM | POA: Diagnosis not present

## 2016-09-14 DIAGNOSIS — R5383 Other fatigue: Secondary | ICD-10-CM | POA: Diagnosis not present

## 2016-09-14 DIAGNOSIS — Z299 Encounter for prophylactic measures, unspecified: Secondary | ICD-10-CM | POA: Diagnosis not present

## 2016-09-14 DIAGNOSIS — E7800 Pure hypercholesterolemia, unspecified: Secondary | ICD-10-CM | POA: Diagnosis not present

## 2016-09-14 DIAGNOSIS — Z1211 Encounter for screening for malignant neoplasm of colon: Secondary | ICD-10-CM | POA: Diagnosis not present

## 2016-09-14 DIAGNOSIS — Z125 Encounter for screening for malignant neoplasm of prostate: Secondary | ICD-10-CM | POA: Diagnosis not present

## 2016-09-14 DIAGNOSIS — Z87891 Personal history of nicotine dependence: Secondary | ICD-10-CM | POA: Diagnosis not present

## 2016-09-14 DIAGNOSIS — Z0000 Encounter for general adult medical examination without abnormal findings: Secondary | ICD-10-CM | POA: Diagnosis not present

## 2016-09-14 DIAGNOSIS — Z7189 Other specified counseling: Secondary | ICD-10-CM | POA: Diagnosis not present

## 2016-09-15 DIAGNOSIS — I2510 Atherosclerotic heart disease of native coronary artery without angina pectoris: Secondary | ICD-10-CM | POA: Diagnosis not present

## 2016-09-15 DIAGNOSIS — E7800 Pure hypercholesterolemia, unspecified: Secondary | ICD-10-CM | POA: Diagnosis not present

## 2016-09-28 DIAGNOSIS — R0602 Shortness of breath: Secondary | ICD-10-CM | POA: Diagnosis not present

## 2016-09-28 DIAGNOSIS — Z720 Tobacco use: Secondary | ICD-10-CM | POA: Diagnosis not present

## 2016-09-28 DIAGNOSIS — J301 Allergic rhinitis due to pollen: Secondary | ICD-10-CM | POA: Diagnosis not present

## 2016-09-28 DIAGNOSIS — R0902 Hypoxemia: Secondary | ICD-10-CM | POA: Diagnosis not present

## 2016-09-28 DIAGNOSIS — R05 Cough: Secondary | ICD-10-CM | POA: Diagnosis not present

## 2016-09-28 DIAGNOSIS — Z8701 Personal history of pneumonia (recurrent): Secondary | ICD-10-CM | POA: Diagnosis not present

## 2016-10-18 DIAGNOSIS — I2510 Atherosclerotic heart disease of native coronary artery without angina pectoris: Secondary | ICD-10-CM | POA: Diagnosis not present

## 2016-10-18 DIAGNOSIS — E7800 Pure hypercholesterolemia, unspecified: Secondary | ICD-10-CM | POA: Diagnosis not present

## 2017-01-11 DIAGNOSIS — I2510 Atherosclerotic heart disease of native coronary artery without angina pectoris: Secondary | ICD-10-CM | POA: Diagnosis not present

## 2017-01-11 DIAGNOSIS — Z299 Encounter for prophylactic measures, unspecified: Secondary | ICD-10-CM | POA: Diagnosis not present

## 2017-01-11 DIAGNOSIS — E7800 Pure hypercholesterolemia, unspecified: Secondary | ICD-10-CM | POA: Diagnosis not present

## 2017-01-11 DIAGNOSIS — F419 Anxiety disorder, unspecified: Secondary | ICD-10-CM | POA: Diagnosis not present

## 2017-01-11 DIAGNOSIS — Z6826 Body mass index (BMI) 26.0-26.9, adult: Secondary | ICD-10-CM | POA: Diagnosis not present

## 2017-01-11 DIAGNOSIS — J309 Allergic rhinitis, unspecified: Secondary | ICD-10-CM | POA: Diagnosis not present

## 2017-01-22 DIAGNOSIS — E7800 Pure hypercholesterolemia, unspecified: Secondary | ICD-10-CM | POA: Diagnosis not present

## 2017-01-22 DIAGNOSIS — I2510 Atherosclerotic heart disease of native coronary artery without angina pectoris: Secondary | ICD-10-CM | POA: Diagnosis not present

## 2017-02-22 DIAGNOSIS — E7800 Pure hypercholesterolemia, unspecified: Secondary | ICD-10-CM | POA: Diagnosis not present

## 2017-02-22 DIAGNOSIS — R42 Dizziness and giddiness: Secondary | ICD-10-CM | POA: Diagnosis not present

## 2017-02-22 DIAGNOSIS — Z6827 Body mass index (BMI) 27.0-27.9, adult: Secondary | ICD-10-CM | POA: Diagnosis not present

## 2017-02-22 DIAGNOSIS — Z713 Dietary counseling and surveillance: Secondary | ICD-10-CM | POA: Diagnosis not present

## 2017-02-22 DIAGNOSIS — I2510 Atherosclerotic heart disease of native coronary artery without angina pectoris: Secondary | ICD-10-CM | POA: Diagnosis not present

## 2017-02-22 DIAGNOSIS — R109 Unspecified abdominal pain: Secondary | ICD-10-CM | POA: Diagnosis not present

## 2017-02-22 DIAGNOSIS — I10 Essential (primary) hypertension: Secondary | ICD-10-CM | POA: Diagnosis not present

## 2017-02-22 DIAGNOSIS — Z299 Encounter for prophylactic measures, unspecified: Secondary | ICD-10-CM | POA: Diagnosis not present

## 2017-03-13 DIAGNOSIS — H25813 Combined forms of age-related cataract, bilateral: Secondary | ICD-10-CM | POA: Diagnosis not present

## 2017-03-29 DIAGNOSIS — I2510 Atherosclerotic heart disease of native coronary artery without angina pectoris: Secondary | ICD-10-CM | POA: Diagnosis not present

## 2017-03-29 DIAGNOSIS — I10 Essential (primary) hypertension: Secondary | ICD-10-CM | POA: Diagnosis not present

## 2017-03-29 DIAGNOSIS — Z713 Dietary counseling and surveillance: Secondary | ICD-10-CM | POA: Diagnosis not present

## 2017-03-29 DIAGNOSIS — E7800 Pure hypercholesterolemia, unspecified: Secondary | ICD-10-CM | POA: Diagnosis not present

## 2017-03-29 DIAGNOSIS — F419 Anxiety disorder, unspecified: Secondary | ICD-10-CM | POA: Diagnosis not present

## 2017-03-29 DIAGNOSIS — Z299 Encounter for prophylactic measures, unspecified: Secondary | ICD-10-CM | POA: Diagnosis not present

## 2017-03-29 DIAGNOSIS — R269 Unspecified abnormalities of gait and mobility: Secondary | ICD-10-CM | POA: Diagnosis not present

## 2017-04-03 DIAGNOSIS — I6782 Cerebral ischemia: Secondary | ICD-10-CM | POA: Diagnosis not present

## 2017-04-03 DIAGNOSIS — R42 Dizziness and giddiness: Secondary | ICD-10-CM | POA: Diagnosis not present

## 2017-04-03 DIAGNOSIS — J320 Chronic maxillary sinusitis: Secondary | ICD-10-CM | POA: Diagnosis not present

## 2017-04-03 DIAGNOSIS — R279 Unspecified lack of coordination: Secondary | ICD-10-CM | POA: Diagnosis not present

## 2017-04-03 DIAGNOSIS — J322 Chronic ethmoidal sinusitis: Secondary | ICD-10-CM | POA: Diagnosis not present

## 2017-04-09 DIAGNOSIS — R260 Ataxic gait: Secondary | ICD-10-CM | POA: Diagnosis not present

## 2017-04-28 DIAGNOSIS — H25813 Combined forms of age-related cataract, bilateral: Secondary | ICD-10-CM | POA: Diagnosis not present

## 2017-05-07 DIAGNOSIS — E7800 Pure hypercholesterolemia, unspecified: Secondary | ICD-10-CM | POA: Diagnosis not present

## 2017-05-07 DIAGNOSIS — I2510 Atherosclerotic heart disease of native coronary artery without angina pectoris: Secondary | ICD-10-CM | POA: Diagnosis not present

## 2017-05-11 DIAGNOSIS — Z6826 Body mass index (BMI) 26.0-26.9, adult: Secondary | ICD-10-CM | POA: Diagnosis not present

## 2017-05-11 DIAGNOSIS — E7800 Pure hypercholesterolemia, unspecified: Secondary | ICD-10-CM | POA: Diagnosis not present

## 2017-05-11 DIAGNOSIS — Z299 Encounter for prophylactic measures, unspecified: Secondary | ICD-10-CM | POA: Diagnosis not present

## 2017-05-11 DIAGNOSIS — I2510 Atherosclerotic heart disease of native coronary artery without angina pectoris: Secondary | ICD-10-CM | POA: Diagnosis not present

## 2017-05-11 DIAGNOSIS — I10 Essential (primary) hypertension: Secondary | ICD-10-CM | POA: Diagnosis not present

## 2017-05-11 DIAGNOSIS — F419 Anxiety disorder, unspecified: Secondary | ICD-10-CM | POA: Diagnosis not present

## 2017-05-22 DIAGNOSIS — H25811 Combined forms of age-related cataract, right eye: Secondary | ICD-10-CM | POA: Diagnosis not present

## 2017-06-07 DIAGNOSIS — H259 Unspecified age-related cataract: Secondary | ICD-10-CM | POA: Diagnosis not present

## 2017-06-07 DIAGNOSIS — H25811 Combined forms of age-related cataract, right eye: Secondary | ICD-10-CM | POA: Diagnosis not present

## 2017-06-07 DIAGNOSIS — Z955 Presence of coronary angioplasty implant and graft: Secondary | ICD-10-CM | POA: Diagnosis not present

## 2017-06-07 DIAGNOSIS — Z7982 Long term (current) use of aspirin: Secondary | ICD-10-CM | POA: Diagnosis not present

## 2017-06-07 DIAGNOSIS — E7800 Pure hypercholesterolemia, unspecified: Secondary | ICD-10-CM | POA: Diagnosis not present

## 2017-06-07 DIAGNOSIS — Z888 Allergy status to other drugs, medicaments and biological substances status: Secondary | ICD-10-CM | POA: Diagnosis not present

## 2017-06-07 DIAGNOSIS — I10 Essential (primary) hypertension: Secondary | ICD-10-CM | POA: Diagnosis not present

## 2017-06-07 DIAGNOSIS — F17210 Nicotine dependence, cigarettes, uncomplicated: Secondary | ICD-10-CM | POA: Diagnosis not present

## 2017-06-07 DIAGNOSIS — K219 Gastro-esophageal reflux disease without esophagitis: Secondary | ICD-10-CM | POA: Diagnosis not present

## 2017-06-18 DIAGNOSIS — E7800 Pure hypercholesterolemia, unspecified: Secondary | ICD-10-CM | POA: Diagnosis not present

## 2017-06-18 DIAGNOSIS — I2510 Atherosclerotic heart disease of native coronary artery without angina pectoris: Secondary | ICD-10-CM | POA: Diagnosis not present

## 2017-07-04 DIAGNOSIS — H25812 Combined forms of age-related cataract, left eye: Secondary | ICD-10-CM | POA: Diagnosis not present

## 2017-07-12 DIAGNOSIS — E785 Hyperlipidemia, unspecified: Secondary | ICD-10-CM | POA: Diagnosis not present

## 2017-07-12 DIAGNOSIS — Z9841 Cataract extraction status, right eye: Secondary | ICD-10-CM | POA: Diagnosis not present

## 2017-07-12 DIAGNOSIS — I10 Essential (primary) hypertension: Secondary | ICD-10-CM | POA: Diagnosis not present

## 2017-07-12 DIAGNOSIS — Z955 Presence of coronary angioplasty implant and graft: Secondary | ICD-10-CM | POA: Diagnosis not present

## 2017-07-12 DIAGNOSIS — Z7982 Long term (current) use of aspirin: Secondary | ICD-10-CM | POA: Diagnosis not present

## 2017-07-12 DIAGNOSIS — Z79899 Other long term (current) drug therapy: Secondary | ICD-10-CM | POA: Diagnosis not present

## 2017-07-12 DIAGNOSIS — K219 Gastro-esophageal reflux disease without esophagitis: Secondary | ICD-10-CM | POA: Diagnosis not present

## 2017-07-12 DIAGNOSIS — I2510 Atherosclerotic heart disease of native coronary artery without angina pectoris: Secondary | ICD-10-CM | POA: Diagnosis not present

## 2017-07-12 DIAGNOSIS — F17210 Nicotine dependence, cigarettes, uncomplicated: Secondary | ICD-10-CM | POA: Diagnosis not present

## 2017-07-12 DIAGNOSIS — Z888 Allergy status to other drugs, medicaments and biological substances status: Secondary | ICD-10-CM | POA: Diagnosis not present

## 2017-07-12 DIAGNOSIS — H259 Unspecified age-related cataract: Secondary | ICD-10-CM | POA: Diagnosis not present

## 2017-07-12 DIAGNOSIS — Z961 Presence of intraocular lens: Secondary | ICD-10-CM | POA: Diagnosis not present

## 2017-07-12 DIAGNOSIS — H25812 Combined forms of age-related cataract, left eye: Secondary | ICD-10-CM | POA: Diagnosis not present

## 2017-07-16 DIAGNOSIS — E7800 Pure hypercholesterolemia, unspecified: Secondary | ICD-10-CM | POA: Diagnosis not present

## 2017-07-16 DIAGNOSIS — I2510 Atherosclerotic heart disease of native coronary artery without angina pectoris: Secondary | ICD-10-CM | POA: Diagnosis not present

## 2017-08-14 DIAGNOSIS — E7800 Pure hypercholesterolemia, unspecified: Secondary | ICD-10-CM | POA: Diagnosis not present

## 2017-08-14 DIAGNOSIS — I2510 Atherosclerotic heart disease of native coronary artery without angina pectoris: Secondary | ICD-10-CM | POA: Diagnosis not present

## 2017-09-18 DIAGNOSIS — Z23 Encounter for immunization: Secondary | ICD-10-CM | POA: Diagnosis not present

## 2017-10-08 DIAGNOSIS — Z299 Encounter for prophylactic measures, unspecified: Secondary | ICD-10-CM | POA: Diagnosis not present

## 2017-10-08 DIAGNOSIS — J329 Chronic sinusitis, unspecified: Secondary | ICD-10-CM | POA: Diagnosis not present

## 2017-10-08 DIAGNOSIS — Z6827 Body mass index (BMI) 27.0-27.9, adult: Secondary | ICD-10-CM | POA: Diagnosis not present

## 2017-10-08 DIAGNOSIS — Z713 Dietary counseling and surveillance: Secondary | ICD-10-CM | POA: Diagnosis not present

## 2017-10-08 DIAGNOSIS — Z87891 Personal history of nicotine dependence: Secondary | ICD-10-CM | POA: Diagnosis not present

## 2017-10-29 DIAGNOSIS — I2510 Atherosclerotic heart disease of native coronary artery without angina pectoris: Secondary | ICD-10-CM | POA: Diagnosis not present

## 2017-10-29 DIAGNOSIS — E7800 Pure hypercholesterolemia, unspecified: Secondary | ICD-10-CM | POA: Diagnosis not present

## 2017-12-10 DIAGNOSIS — Z8701 Personal history of pneumonia (recurrent): Secondary | ICD-10-CM | POA: Diagnosis not present

## 2017-12-10 DIAGNOSIS — Z720 Tobacco use: Secondary | ICD-10-CM | POA: Diagnosis not present

## 2017-12-10 DIAGNOSIS — J301 Allergic rhinitis due to pollen: Secondary | ICD-10-CM | POA: Diagnosis not present

## 2017-12-11 ENCOUNTER — Telehealth: Payer: Self-pay | Admitting: Cardiovascular Disease

## 2017-12-11 NOTE — Telephone Encounter (Signed)
Records received from [facility] internal Medicine. Placed in Chart Prep for patient 01/16/18 New patient Appt.

## 2017-12-21 DIAGNOSIS — I2510 Atherosclerotic heart disease of native coronary artery without angina pectoris: Secondary | ICD-10-CM | POA: Diagnosis not present

## 2017-12-21 DIAGNOSIS — E7800 Pure hypercholesterolemia, unspecified: Secondary | ICD-10-CM | POA: Diagnosis not present

## 2017-12-25 DIAGNOSIS — F419 Anxiety disorder, unspecified: Secondary | ICD-10-CM | POA: Diagnosis not present

## 2017-12-25 DIAGNOSIS — Z299 Encounter for prophylactic measures, unspecified: Secondary | ICD-10-CM | POA: Diagnosis not present

## 2017-12-25 DIAGNOSIS — R109 Unspecified abdominal pain: Secondary | ICD-10-CM | POA: Diagnosis not present

## 2017-12-25 DIAGNOSIS — E7800 Pure hypercholesterolemia, unspecified: Secondary | ICD-10-CM | POA: Diagnosis not present

## 2017-12-25 DIAGNOSIS — K5732 Diverticulitis of large intestine without perforation or abscess without bleeding: Secondary | ICD-10-CM | POA: Diagnosis not present

## 2017-12-25 DIAGNOSIS — I2510 Atherosclerotic heart disease of native coronary artery without angina pectoris: Secondary | ICD-10-CM | POA: Diagnosis not present

## 2017-12-25 DIAGNOSIS — Z6826 Body mass index (BMI) 26.0-26.9, adult: Secondary | ICD-10-CM | POA: Diagnosis not present

## 2017-12-25 DIAGNOSIS — I10 Essential (primary) hypertension: Secondary | ICD-10-CM | POA: Diagnosis not present

## 2018-01-02 ENCOUNTER — Encounter: Payer: Self-pay | Admitting: Cardiovascular Disease

## 2018-01-14 NOTE — Progress Notes (Signed)
Cardiology Office Note   Date:  01/17/2018   ID:  Clayton Braun, DOB 11-23-1950, MRN [id number]  PCP:  Glenda Chroman, MD  Cardiologist:   Jenkins Rouge, MD   No chief complaint on file.     History of Present Illness: Clayton Braun is a 67 y.o. male who presents for consultation regarding CAD. Referred by Dr Woody Seller. Previous patient of Dr Ron Parker not seen in cardiology since 2013.  Had stent to RCA in 2002 Last cath in 2009 patent stent and 50% mid LAD disease. Last stress myovue 12/12/11 reviewed normal with no ischemia  CRF HTN on ARB has also been maintained on ASA, beta blocker and statin   His anginal equivalent was flushing. Only gets this now when he uses niacin products  Has eczema he Rx with tar and sunlight   Fatigue that seems non cardiac in nature  Had his grand daughter [name] with him today I have seen his wife in past for palpitations    Past Medical History:  Diagnosis Date  . Abdominal pain   . Acute respiratory disease   . Allergic rhinitis   . Anxiety   . AP (abdominal pain)   . Blood in stool   . Body mass index 27.0-27.9, adult   . Bradycardia, sinus   . Bronchitis   . Candidiasis    ORAL  . Chest pain    March, 2013  . Coronary artery disease    Stent to RCA, 2002   /    cath 2009, stent patent, residual 50% LAD  . Depression   . Diverticulosis 2006   COLONOSCOPY DR. Lindalou Hose, H/O POLYP  . Dizziness   . Dyslipidemia   . Fatigue   . Gastroenteritis and colitis, viral   . GERD (gastroesophageal reflux disease)   . Hypercholesterolemia   . Hyperkalemia    Per the patient, 2013  . Hyperlipidemia   . Hypertension   . Hypertriglyceridemia   . Nummular eczema   . Osteoarthritis   . Pharyngitis   . Pityriasis rosea   . Psoriasis   . Rash   . Rhinosinusitis   . Sinusitis   . Skin neoplasm   . Tinea cruris   . Wart     Past Surgical History:  Procedure Laterality Date  . APPENDECTOMY    . HERNIA REPAIR    . PENILE PROSTHESIS  PLACEMENT       Current Outpatient Medications  Medication Sig Dispense Refill  . aspirin 81 MG chewable tablet Chew 81 mg by mouth daily.      Marland Kitchen atenolol (TENORMIN) 25 MG tablet Take 25 mg by mouth daily.      Marland Kitchen atorvastatin (LIPITOR) 10 MG tablet Take 10 mg by mouth at bedtime.      . budesonide-formoterol (SYMBICORT) 160-4.5 MCG/ACT inhaler Inhale 1 puff into the lungs daily.    . clotrimazole-betamethasone (LOTRISONE) cream Apply 1 application topically 2 (two) times daily.    Marland Kitchen esomeprazole (NEXIUM) 40 MG capsule Take 40 mg by mouth daily.      Marland Kitchen Fexofenadine HCl (ALLEGRA ALLERGY PO) Take 180 mg by mouth daily.    . FOLBIC 2.5-25-2 MG TABS Take 1 tablet by mouth Daily.    Marland Kitchen gemfibrozil (LOPID) 600 MG tablet Take 600 mg by mouth 2 (two) times daily.      . montelukast (SINGULAIR) 10 MG tablet Take 10 mg by mouth at bedtime.    . Multiple Vitamins-Minerals (CENTRUM SILVER PO)  Take 1 tablet by mouth daily.    . pantoprazole (PROTONIX) 40 MG tablet Take 40 mg by mouth daily.    Marland Kitchen PARoxetine (PAXIL) 20 MG tablet Take 20 mg by mouth daily.      . ramipril (ALTACE) 2.5 MG capsule Take 2.5 mg by mouth daily.       No current facility-administered medications for this visit.     Allergies:   Niacin and related    Social History:  The patient  reports that he has been smoking cigarettes.  He has a 9.60 pack-year smoking history. He has never used smokeless tobacco. He reports that he drinks alcohol. He reports that he does not use drugs.   Family History:  The patient's family history includes COPD in his father; Heart attack in his mother; Heart disease in his other; Lung disease in his other.    ROS:  Please see the history of present illness.   Otherwise, review of systems are positive for none.   All other systems are reviewed and negative.    PHYSICAL EXAM: VS:  BP 118/70   Pulse (!) 57   Ht 5\' 7"  (1.702 m)   Wt 169 lb (76.7 kg)   SpO2 97%   BMI 26.47 kg/m  , BMI Body mass  index is 26.47 kg/m. Affect appropriate Healthy:  appears stated age 88: normal Neck supple with no adenopathy JVP normal no bruits no thyromegaly Lungs clear with no wheezing and good diaphragmatic motion Heart:  S1/S2 no murmur, no rub, gallop or click PMI normal Abdomen: benighn, BS positve, no tenderness, no AAA no bruit.  No HSM or HJR Distal pulses intact with no bruits No edema Neuro non-focal Skin warm and dry eczema navel, knees and lower back  No muscular weakness    EKG:  2013 SR rate 65 normal ECG  01/17/18  SR rate 55 normal    Recent Labs: No results found for requested labs within last 8760 hours.    Lipid Panel    Component Value Date/Time   CHOL  09/26/2007 0534    117        ATP III CLASSIFICATION:  <200     mg/dL   Desirable  200-239  mg/dL   Borderline High  >=240    mg/dL   High   TRIG 191 (H) 09/26/2007 0534   HDL 24 (L) 09/26/2007 0534   CHOLHDL 4.9 09/26/2007 0534   VLDL 38 09/26/2007 0534   LDLCALC  09/26/2007 0534    55        Total Cholesterol/HDL:CHD Risk Coronary Heart Disease Risk Table                     Men   Women  1/2 Average Risk   3.4   3.3      Wt Readings from Last 3 Encounters:  01/17/18 169 lb (76.7 kg)  12/07/11 169 lb (76.7 kg)      Other studies Reviewed: Additional studies/ records that were reviewed today include: Notes Dr Ron Parker, cath 2002 and 2009 myovue 2013 ECG notes primary Dr Woody Seller and labs .    ASSESSMENT AN@ PLAN:  1.  CAD:  Distant history of RCA stent 2002 patent by cath in 2009 residual 50% LAD with normal myovue in 2013  Will update exercise myovue since he had moderate LAD disease and has not had testing in 6 years Continue ASA, beta blocker and statin  2. HTN:  .Well controlled.  Continue current medications and low sodium Dash type diet.   3. HLD:  Continue statin labs with primary  4. Anxiety/Depression : mood stable on Paxil  5. GERD:  Continue protonix low carb diet  6. Psoriasis:   Sunlight and tar Rx working well f/u derm  Current medicines are reviewed at length with the patient today.  The patient does not have concerns regarding medicines.  The following changes have been made:  no change  Labs/ tests ordered today include: Ex Myovue   Orders Placed This Encounter  Procedures  . MYOCARDIAL PERFUSION IMAGING  . EKG 12-Lead     Disposition:   FU with cardiology in a year      Signed, Jenkins Rouge, MD  01/17/2018 4:00 PM    [address], [place], [place]  [address] Phone: [phone]; Fax: [phone]

## 2018-01-16 ENCOUNTER — Ambulatory Visit: Payer: Self-pay | Admitting: Cardiovascular Disease

## 2018-01-17 ENCOUNTER — Ambulatory Visit (INDEPENDENT_AMBULATORY_CARE_PROVIDER_SITE_OTHER): Payer: Medicare Other | Admitting: Cardiovascular Disease

## 2018-01-17 ENCOUNTER — Encounter: Payer: Self-pay | Admitting: Cardiovascular Disease

## 2018-01-17 VITALS — BP 118/70 | HR 57 | Ht 67.0 in | Wt 169.0 lb

## 2018-01-17 DIAGNOSIS — I2583 Coronary atherosclerosis due to lipid rich plaque: Secondary | ICD-10-CM

## 2018-01-17 DIAGNOSIS — I2510 Atherosclerotic heart disease of native coronary artery without angina pectoris: Secondary | ICD-10-CM

## 2018-01-17 NOTE — Patient Instructions (Addendum)

## 2018-01-22 DIAGNOSIS — F17210 Nicotine dependence, cigarettes, uncomplicated: Secondary | ICD-10-CM | POA: Diagnosis not present

## 2018-01-22 DIAGNOSIS — K5730 Diverticulosis of large intestine without perforation or abscess without bleeding: Secondary | ICD-10-CM | POA: Diagnosis not present

## 2018-01-22 DIAGNOSIS — Z888 Allergy status to other drugs, medicaments and biological substances status: Secondary | ICD-10-CM | POA: Diagnosis not present

## 2018-01-22 DIAGNOSIS — R1030 Lower abdominal pain, unspecified: Secondary | ICD-10-CM | POA: Diagnosis not present

## 2018-01-22 DIAGNOSIS — K760 Fatty (change of) liver, not elsewhere classified: Secondary | ICD-10-CM | POA: Diagnosis not present

## 2018-01-23 DIAGNOSIS — M5416 Radiculopathy, lumbar region: Secondary | ICD-10-CM | POA: Diagnosis not present

## 2018-01-23 DIAGNOSIS — M5136 Other intervertebral disc degeneration, lumbar region: Secondary | ICD-10-CM | POA: Diagnosis not present

## 2018-01-23 DIAGNOSIS — M47816 Spondylosis without myelopathy or radiculopathy, lumbar region: Secondary | ICD-10-CM | POA: Diagnosis not present

## 2018-01-24 ENCOUNTER — Encounter (HOSPITAL_COMMUNITY): Payer: 59

## 2018-01-25 DIAGNOSIS — E7800 Pure hypercholesterolemia, unspecified: Secondary | ICD-10-CM | POA: Diagnosis not present

## 2018-01-25 DIAGNOSIS — I2510 Atherosclerotic heart disease of native coronary artery without angina pectoris: Secondary | ICD-10-CM | POA: Diagnosis not present

## 2018-01-31 DIAGNOSIS — M5136 Other intervertebral disc degeneration, lumbar region: Secondary | ICD-10-CM | POA: Diagnosis not present

## 2018-01-31 DIAGNOSIS — M47816 Spondylosis without myelopathy or radiculopathy, lumbar region: Secondary | ICD-10-CM | POA: Diagnosis not present

## 2018-01-31 DIAGNOSIS — M5416 Radiculopathy, lumbar region: Secondary | ICD-10-CM | POA: Diagnosis not present

## 2018-02-02 DIAGNOSIS — M5416 Radiculopathy, lumbar region: Secondary | ICD-10-CM | POA: Diagnosis not present

## 2018-02-05 DIAGNOSIS — M5136 Other intervertebral disc degeneration, lumbar region: Secondary | ICD-10-CM | POA: Diagnosis not present

## 2018-02-05 DIAGNOSIS — M5126 Other intervertebral disc displacement, lumbar region: Secondary | ICD-10-CM | POA: Diagnosis not present

## 2018-02-05 DIAGNOSIS — M5416 Radiculopathy, lumbar region: Secondary | ICD-10-CM | POA: Diagnosis not present

## 2018-02-05 DIAGNOSIS — M47816 Spondylosis without myelopathy or radiculopathy, lumbar region: Secondary | ICD-10-CM | POA: Diagnosis not present

## 2018-02-06 DIAGNOSIS — Z6825 Body mass index (BMI) 25.0-25.9, adult: Secondary | ICD-10-CM | POA: Diagnosis not present

## 2018-02-06 DIAGNOSIS — E7800 Pure hypercholesterolemia, unspecified: Secondary | ICD-10-CM | POA: Diagnosis not present

## 2018-02-06 DIAGNOSIS — R5383 Other fatigue: Secondary | ICD-10-CM | POA: Diagnosis not present

## 2018-02-06 DIAGNOSIS — I10 Essential (primary) hypertension: Secondary | ICD-10-CM | POA: Diagnosis not present

## 2018-02-06 DIAGNOSIS — Z1339 Encounter for screening examination for other mental health and behavioral disorders: Secondary | ICD-10-CM | POA: Diagnosis not present

## 2018-02-06 DIAGNOSIS — Z1331 Encounter for screening for depression: Secondary | ICD-10-CM | POA: Diagnosis not present

## 2018-02-06 DIAGNOSIS — Z1211 Encounter for screening for malignant neoplasm of colon: Secondary | ICD-10-CM | POA: Diagnosis not present

## 2018-02-06 DIAGNOSIS — Z299 Encounter for prophylactic measures, unspecified: Secondary | ICD-10-CM | POA: Diagnosis not present

## 2018-02-06 DIAGNOSIS — Z79899 Other long term (current) drug therapy: Secondary | ICD-10-CM | POA: Diagnosis not present

## 2018-02-06 DIAGNOSIS — Z0000 Encounter for general adult medical examination without abnormal findings: Secondary | ICD-10-CM | POA: Diagnosis not present

## 2018-02-06 DIAGNOSIS — Z125 Encounter for screening for malignant neoplasm of prostate: Secondary | ICD-10-CM | POA: Diagnosis not present

## 2018-02-06 DIAGNOSIS — I2510 Atherosclerotic heart disease of native coronary artery without angina pectoris: Secondary | ICD-10-CM | POA: Diagnosis not present

## 2018-02-06 DIAGNOSIS — Z7189 Other specified counseling: Secondary | ICD-10-CM | POA: Diagnosis not present

## 2018-02-07 DIAGNOSIS — K439 Ventral hernia without obstruction or gangrene: Secondary | ICD-10-CM | POA: Diagnosis not present

## 2018-02-07 DIAGNOSIS — D72829 Elevated white blood cell count, unspecified: Secondary | ICD-10-CM | POA: Diagnosis not present

## 2018-02-07 DIAGNOSIS — K5730 Diverticulosis of large intestine without perforation or abscess without bleeding: Secondary | ICD-10-CM | POA: Diagnosis not present

## 2018-02-07 DIAGNOSIS — Z960 Presence of urogenital implants: Secondary | ICD-10-CM | POA: Diagnosis not present

## 2018-02-07 DIAGNOSIS — I700 Atherosclerosis of aorta: Secondary | ICD-10-CM | POA: Diagnosis not present

## 2018-02-08 DIAGNOSIS — Z299 Encounter for prophylactic measures, unspecified: Secondary | ICD-10-CM | POA: Diagnosis not present

## 2018-02-08 DIAGNOSIS — D72829 Elevated white blood cell count, unspecified: Secondary | ICD-10-CM | POA: Diagnosis not present

## 2018-02-08 DIAGNOSIS — Z6825 Body mass index (BMI) 25.0-25.9, adult: Secondary | ICD-10-CM | POA: Diagnosis not present

## 2018-02-08 DIAGNOSIS — R350 Frequency of micturition: Secondary | ICD-10-CM | POA: Diagnosis not present

## 2018-02-08 DIAGNOSIS — R109 Unspecified abdominal pain: Secondary | ICD-10-CM | POA: Diagnosis not present

## 2018-02-08 DIAGNOSIS — Z713 Dietary counseling and surveillance: Secondary | ICD-10-CM | POA: Diagnosis not present

## 2018-02-12 DIAGNOSIS — M47816 Spondylosis without myelopathy or radiculopathy, lumbar region: Secondary | ICD-10-CM | POA: Diagnosis not present

## 2018-02-12 DIAGNOSIS — M5090 Cervical disc disorder, unspecified, unspecified cervical region: Secondary | ICD-10-CM | POA: Diagnosis not present

## 2018-02-12 DIAGNOSIS — M5416 Radiculopathy, lumbar region: Secondary | ICD-10-CM | POA: Diagnosis not present

## 2018-02-12 DIAGNOSIS — M5126 Other intervertebral disc displacement, lumbar region: Secondary | ICD-10-CM | POA: Diagnosis not present

## 2018-02-19 DIAGNOSIS — J410 Simple chronic bronchitis: Secondary | ICD-10-CM | POA: Diagnosis not present

## 2018-02-19 DIAGNOSIS — Z8701 Personal history of pneumonia (recurrent): Secondary | ICD-10-CM | POA: Diagnosis not present

## 2018-02-19 DIAGNOSIS — M5416 Radiculopathy, lumbar region: Secondary | ICD-10-CM | POA: Diagnosis not present

## 2018-02-19 DIAGNOSIS — Z720 Tobacco use: Secondary | ICD-10-CM | POA: Diagnosis not present

## 2018-02-19 DIAGNOSIS — J3489 Other specified disorders of nose and nasal sinuses: Secondary | ICD-10-CM | POA: Diagnosis not present

## 2018-03-05 DIAGNOSIS — M5416 Radiculopathy, lumbar region: Secondary | ICD-10-CM | POA: Diagnosis not present

## 2018-03-05 DIAGNOSIS — M47816 Spondylosis without myelopathy or radiculopathy, lumbar region: Secondary | ICD-10-CM | POA: Diagnosis not present

## 2018-03-05 DIAGNOSIS — M5136 Other intervertebral disc degeneration, lumbar region: Secondary | ICD-10-CM | POA: Diagnosis not present

## 2018-03-05 DIAGNOSIS — M5126 Other intervertebral disc displacement, lumbar region: Secondary | ICD-10-CM | POA: Diagnosis not present

## 2018-03-05 DIAGNOSIS — Z79891 Long term (current) use of opiate analgesic: Secondary | ICD-10-CM | POA: Diagnosis not present

## 2018-03-11 ENCOUNTER — Other Ambulatory Visit: Payer: Self-pay | Admitting: Neurosurgery

## 2018-03-11 ENCOUNTER — Telehealth: Payer: Self-pay

## 2018-03-11 DIAGNOSIS — M5126 Other intervertebral disc displacement, lumbar region: Secondary | ICD-10-CM | POA: Diagnosis not present

## 2018-03-11 DIAGNOSIS — M5416 Radiculopathy, lumbar region: Secondary | ICD-10-CM | POA: Diagnosis not present

## 2018-03-11 DIAGNOSIS — M545 Low back pain: Secondary | ICD-10-CM | POA: Diagnosis not present

## 2018-03-11 DIAGNOSIS — Z6827 Body mass index (BMI) 27.0-27.9, adult: Secondary | ICD-10-CM | POA: Diagnosis not present

## 2018-03-11 NOTE — Telephone Encounter (Signed)
   [facility] Pre-operative Risk Assessment    Request for surgical clearance:  1. What type of surgery is being performed? Left Lumbar 4-5 Microdiscectomy   2. When is this surgery scheduled? 03/22/2018   3. What type of clearance is required (medical clearance vs. Pharmacy clearance to hold med vs. Both)? both  4. Are there any medications that need to be held prior to surgery and how long? Mot listed, Aspirin   5. Practice name and name of physician performing surgery? [facility], Dr. Vertell Limber   6. What is your office phone number? [phone]    7.   What is your office fax number? [phone]  8.   Anesthesia type (None, local, MAC, general) ? Not listed    Clayton Braun 03/11/2018, 3:19 PM  _________________________________________________________________   (provider comments below)

## 2018-03-11 NOTE — Telephone Encounter (Signed)
[name], this pt is planned for  Left Lumbar 4-5 Microdiscectomy. They are requesting to hold aspirin. Can aspirin be held and for how long?  Had stent to RCA in 2002. Last cath in 2009 patent stent and 50% mid LAD disease. Last stress myovue 12/12/11 normal with no ischemia    Please route your response back to CV DIV PREOP  Thank you

## 2018-03-11 NOTE — Telephone Encounter (Signed)
Ok to hold asa for 7 days before procedure

## 2018-03-12 NOTE — Telephone Encounter (Signed)
   Primary Cardiologist: Jenkins Rouge, MD  Chart reviewed as part of pre-operative protocol coverage. Patient was contacted 03/12/2018 in reference to pre-operative risk assessment for pending surgery as outlined below.  Clayton Braun was last seen on 01/17/18 by [name].  Since that day, Clayton Braun has done well with no new complaints. He has a history of stent to RCA in 2002, last cath in 2009 patent stent and 50% mid LAD disease. Last stress myovue 12/12/11 was normal with no ischemia. He is active with some constraints due to back pain. He can complete >4 Mets of activity without chest pain or shortness of breath.   Therefore, based on ACC/AHA guidelines, the patient would be at acceptable risk for the planned procedure without further cardiovascular testing.   Per [name] it is OK to hold the aspirin for 7 days prior to the procedure.   I will route this recommendation to the requesting party via Epic fax function and remove from pre-op pool.  Please call with questions.  Daune Perch, NP 03/12/2018, 4:22 PM

## 2018-03-21 ENCOUNTER — Other Ambulatory Visit: Payer: Self-pay

## 2018-03-21 ENCOUNTER — Encounter (HOSPITAL_COMMUNITY): Payer: Self-pay | Admitting: *Deleted

## 2018-03-21 NOTE — Progress Notes (Signed)
Anesthesia Chart Review:  Pt is a same day work up   Case:  [id number] Date/Time:  03/22/18 1556   Procedure:  Left Lumbar 4-5 Microdiscectomy (Left ) - Left Lumbar 4-5 Microdiscectomy   Anesthesia type:  General   Pre-op diagnosis:  Herniated nucleus pulposus, Lumbar   Location:  [facility] OR ROOM 21 / [facility]   Surgeon:  Erline Levine, MD      DISCUSSION: - Pt is a 67 year old male with hx CAD (s/p stent to RCA 2002)  - At last cardiology office visit, [name] ordered a stress test due to hx moderate LAD disease with no testing in 6 years.  Stress test has not happened yet.  [name] is aware of upcoming surgery and gave ok to hold ASA 7 days prior to surgery  - Pt has cardiac clearance for surgery  PROVIDERS: - PCP is Glenda Chroman, MD - Cardiologist is Jenkins Rouge, MD. Last office visit 01/17/18. Cleared for surgery by Daune Perch, NP 03/12/18   LABS: Will be obtained day of surgery    EKG 01/17/18: sinus bradycardia (55 bpm)   CV:  Carotid duplex 04/09/17:  - No significant plaque. 1-50% stenosis B ICA - B antegrade vertebral flow  Nuclear stress test 12/12/11:  - Abnormal LV perfusion. Limitations and artifact were due to diaphragm or other soft tissue.  There is a small, fixed defects in the basal inferoseptal, basal inferior segments.  The degree of photon reduction was mild.  Seems most consistent with attenuation, however cannot exclude scar in this distribution.  No obvious ischemic defects.  Overall relatively low risk and patient with a reported history of CAD.  Calculated post stress LVEF was 66%.  TID ratio 1.05  Echo 11/13/11:  -LV normal in size and shape.  EF estimated at 60-65%.  LV diastolic function abnormal. -  Minimal mitral regurgitation. -  Trace tricuspid regurgitation. -  Trivial pulmonic regurgitation.   Past Medical History:  Diagnosis Date  . Abdominal pain   . Acute respiratory disease   . Allergic rhinitis   . Anxiety   . AP (abdominal pain)   .  Blood in stool   . Body mass index 27.0-27.9, adult   . Bradycardia, sinus   . Bronchitis   . Candidiasis    ORAL  . Chest pain    March, 2013  . Coronary artery disease    Stent to RCA, 2002   /    cath 2009, stent patent, residual 50% LAD  . Depression   . Diverticulosis 2006   COLONOSCOPY DR. Lindalou Hose, H/O POLYP  . Dizziness    occasionaly  . Dyslipidemia   . Fatigue   . Gastroenteritis and colitis, viral   . GERD (gastroesophageal reflux disease)   . Hypercholesterolemia   . Hyperkalemia    Per the patient, 2013  . Hyperlipidemia   . Hypertension   . Hypertriglyceridemia   . Nummular eczema   . Osteoarthritis   . Pharyngitis   . Pityriasis rosea   . Psoriasis   . Rash   . Rhinosinusitis   . Sinusitis   . Skin neoplasm   . Tinea cruris   . Wart     Past Surgical History:  Procedure Laterality Date  . APPENDECTOMY    . CARDIAC CATHETERIZATION     2002  . COLONOSCOPY    . ESOPHAGOGASTRODUODENOSCOPY    . EYE SURGERY     implants, cataracts  . HERNIA REPAIR    .  NASAL SINUS SURGERY    . PENILE PROSTHESIS PLACEMENT      MEDICATIONS: No current facility-administered medications for this encounter.    Marland Kitchen ALPRAZolam (XANAX) 1 MG tablet  . atenolol (TENORMIN) 25 MG tablet  . atorvastatin (LIPITOR) 10 MG tablet  . FOLBIC 2.5-25-2 MG TABS  . gemfibrozil (LOPID) 600 MG tablet  . HYDROcodone-acetaminophen (NORCO/VICODIN) 5-325 MG tablet  . montelukast (SINGULAIR) 10 MG tablet  . Multiple Vitamins-Minerals (CENTRUM SILVER PO)  . nabumetone (RELAFEN) 750 MG tablet  . pantoprazole (PROTONIX) 40 MG tablet  . PARoxetine (PAXIL) 20 MG tablet  . Polyethyl Glycol-Propyl Glycol (SYSTANE ULTRA OP)  . ramipril (ALTACE) 2.5 MG capsule  . rOPINIRole (REQUIP) 0.25 MG tablet  . aspirin 81 MG chewable tablet  . clotrimazole-betamethasone (LOTRISONE) cream    If labs acceptable day of surgery, I anticipate pt can proceed with surgery as scheduled.  Willeen Cass,  FNP-BC [facility]/Anesthesiology Phone: [phone] 03/21/2018 2:07 PM

## 2018-03-21 NOTE — Progress Notes (Signed)
Cardiologist Nishan Clearance note in shadow chart and epic Stress test ordered in may, not sure if completed Patient verbalized understanding of instructions and had no further questions 7 days prior to surgery STOP taking any Aspirin(unless otherwise instructed by your surgeon), Aleve, Naproxen, Ibuprofen, Motrin, Advil, Goody's, BC's, all herbal medications, fish oil, and all vitamins  Sending to anesthesia for review

## 2018-03-22 ENCOUNTER — Ambulatory Visit (HOSPITAL_COMMUNITY): Payer: Medicare Other | Admitting: Emergency Medicine

## 2018-03-22 ENCOUNTER — Inpatient Hospital Stay (HOSPITAL_COMMUNITY)
Admission: RE | Admit: 2018-03-22 | Discharge: 2018-03-23 | DRG: 520 | Disposition: A | Discharge status: Home / Self Care | Payer: Medicare Other | Source: Ambulatory Visit | Attending: Neurosurgery | Admitting: Neurosurgery

## 2018-03-22 ENCOUNTER — Other Ambulatory Visit: Payer: Self-pay

## 2018-03-22 ENCOUNTER — Encounter (HOSPITAL_COMMUNITY)
Admission: RE | Disposition: A | Discharge status: Home / Self Care | Payer: Self-pay | Source: Ambulatory Visit | Attending: Neurosurgery

## 2018-03-22 ENCOUNTER — Ambulatory Visit (HOSPITAL_COMMUNITY): Payer: Medicare Other

## 2018-03-22 ENCOUNTER — Encounter (HOSPITAL_COMMUNITY): Payer: Self-pay | Admitting: *Deleted

## 2018-03-22 DIAGNOSIS — I10 Essential (primary) hypertension: Secondary | ICD-10-CM | POA: Diagnosis present

## 2018-03-22 DIAGNOSIS — E781 Pure hyperglyceridemia: Secondary | ICD-10-CM | POA: Diagnosis present

## 2018-03-22 DIAGNOSIS — E7800 Pure hypercholesterolemia, unspecified: Secondary | ICD-10-CM | POA: Diagnosis present

## 2018-03-22 DIAGNOSIS — F419 Anxiety disorder, unspecified: Secondary | ICD-10-CM | POA: Diagnosis present

## 2018-03-22 DIAGNOSIS — Z9841 Cataract extraction status, right eye: Secondary | ICD-10-CM | POA: Diagnosis not present

## 2018-03-22 DIAGNOSIS — M5126 Other intervertebral disc displacement, lumbar region: Secondary | ICD-10-CM | POA: Diagnosis present

## 2018-03-22 DIAGNOSIS — M48061 Spinal stenosis, lumbar region without neurogenic claudication: Secondary | ICD-10-CM | POA: Diagnosis not present

## 2018-03-22 DIAGNOSIS — F329 Major depressive disorder, single episode, unspecified: Secondary | ICD-10-CM | POA: Diagnosis present

## 2018-03-22 DIAGNOSIS — L409 Psoriasis, unspecified: Secondary | ICD-10-CM | POA: Diagnosis present

## 2018-03-22 DIAGNOSIS — L42 Pityriasis rosea: Secondary | ICD-10-CM | POA: Diagnosis present

## 2018-03-22 DIAGNOSIS — Z981 Arthrodesis status: Secondary | ICD-10-CM | POA: Diagnosis not present

## 2018-03-22 DIAGNOSIS — Z820 Family history of epilepsy and other diseases of the nervous system: Secondary | ICD-10-CM | POA: Diagnosis not present

## 2018-03-22 DIAGNOSIS — Z961 Presence of intraocular lens: Secondary | ICD-10-CM | POA: Diagnosis present

## 2018-03-22 DIAGNOSIS — E785 Hyperlipidemia, unspecified: Secondary | ICD-10-CM | POA: Diagnosis present

## 2018-03-22 DIAGNOSIS — L300 Nummular dermatitis: Secondary | ICD-10-CM | POA: Diagnosis present

## 2018-03-22 DIAGNOSIS — K219 Gastro-esophageal reflux disease without esophagitis: Secondary | ICD-10-CM | POA: Diagnosis not present

## 2018-03-22 DIAGNOSIS — I340 Nonrheumatic mitral (valve) insufficiency: Secondary | ICD-10-CM | POA: Diagnosis present

## 2018-03-22 DIAGNOSIS — M47816 Spondylosis without myelopathy or radiculopathy, lumbar region: Secondary | ICD-10-CM | POA: Diagnosis present

## 2018-03-22 DIAGNOSIS — M5116 Intervertebral disc disorders with radiculopathy, lumbar region: Principal | ICD-10-CM | POA: Diagnosis present

## 2018-03-22 DIAGNOSIS — M549 Dorsalgia, unspecified: Secondary | ICD-10-CM | POA: Diagnosis not present

## 2018-03-22 DIAGNOSIS — Z8249 Family history of ischemic heart disease and other diseases of the circulatory system: Secondary | ICD-10-CM | POA: Diagnosis not present

## 2018-03-22 DIAGNOSIS — F17210 Nicotine dependence, cigarettes, uncomplicated: Secondary | ICD-10-CM | POA: Diagnosis present

## 2018-03-22 DIAGNOSIS — G2581 Restless legs syndrome: Secondary | ICD-10-CM | POA: Diagnosis present

## 2018-03-22 DIAGNOSIS — Z888 Allergy status to other drugs, medicaments and biological substances status: Secondary | ICD-10-CM | POA: Diagnosis not present

## 2018-03-22 DIAGNOSIS — Z419 Encounter for procedure for purposes other than remedying health state, unspecified: Secondary | ICD-10-CM

## 2018-03-22 DIAGNOSIS — I2510 Atherosclerotic heart disease of native coronary artery without angina pectoris: Secondary | ICD-10-CM | POA: Diagnosis not present

## 2018-03-22 DIAGNOSIS — Z9842 Cataract extraction status, left eye: Secondary | ICD-10-CM

## 2018-03-22 DIAGNOSIS — Z7982 Long term (current) use of aspirin: Secondary | ICD-10-CM

## 2018-03-22 DIAGNOSIS — Z955 Presence of coronary angioplasty implant and graft: Secondary | ICD-10-CM

## 2018-03-22 DIAGNOSIS — M4726 Other spondylosis with radiculopathy, lumbar region: Secondary | ICD-10-CM | POA: Diagnosis not present

## 2018-03-22 HISTORY — PX: LUMBAR LAMINECTOMY/DECOMPRESSION MICRODISCECTOMY: SHX5026

## 2018-03-22 LAB — CBC
HEMATOCRIT: 42.2 % (ref 39.0–52.0)
Hemoglobin: 13.9 g/dL (ref 13.0–17.0)
MCH: 32 pg (ref 26.0–34.0)
MCHC: 32.9 g/dL (ref 30.0–36.0)
MCV: 97.2 fL (ref 78.0–100.0)
PLATELETS: 203 10*3/uL (ref 150–400)
RBC: 4.34 MIL/uL (ref 4.22–5.81)
RDW: 12.7 % (ref 11.5–15.5)
WBC: 7.7 10*3/uL (ref 4.0–10.5)

## 2018-03-22 SURGERY — LUMBAR LAMINECTOMY/DECOMPRESSION MICRODISCECTOMY 1 LEVEL
Anesthesia: General | Site: Spine Lumbar | Laterality: Left

## 2018-03-22 MED ORDER — PANTOPRAZOLE SODIUM 40 MG PO TBEC
40.0000 mg | DELAYED_RELEASE_TABLET | Freq: Every day | ORAL | Status: DC
  Administered 2018-03-22: 40 mg via ORAL
  Filled 2018-03-22: qty 1

## 2018-03-22 MED ORDER — ALPRAZOLAM 0.5 MG PO TABS
0.5000 mg | ORAL_TABLET | Freq: Every day | ORAL | Status: DC
  Administered 2018-03-22: 0.5 mg via ORAL
  Filled 2018-03-22: qty 1

## 2018-03-22 MED ORDER — ONDANSETRON HCL 4 MG/2ML IJ SOLN
INTRAMUSCULAR | Status: AC
  Filled 2018-03-22: qty 2

## 2018-03-22 MED ORDER — FENTANYL CITRATE (PF) 100 MCG/2ML IJ SOLN
INTRAMUSCULAR | Status: DC | PRN
  Administered 2018-03-22 (×4): 50 ug via INTRAVENOUS

## 2018-03-22 MED ORDER — MORPHINE SULFATE (PF) 2 MG/ML IV SOLN: 2.0000 mg | INTRAVENOUS | Status: DC | PRN

## 2018-03-22 MED ORDER — ATENOLOL 25 MG PO TABS: 25.0000 mg | ORAL_TABLET | Freq: Every day | ORAL | Status: DC

## 2018-03-22 MED ORDER — SODIUM CHLORIDE 0.9% FLUSH: 3.0000 mL | Freq: Two times a day (BID) | INTRAVENOUS | Status: DC

## 2018-03-22 MED ORDER — OXYCODONE HCL 5 MG PO TABS
ORAL_TABLET | ORAL | Status: AC
  Filled 2018-03-22: qty 1

## 2018-03-22 MED ORDER — PROPOFOL 10 MG/ML IV BOLUS
INTRAVENOUS | Status: DC | PRN
  Administered 2018-03-22: 160 mg via INTRAVENOUS

## 2018-03-22 MED ORDER — DEXAMETHASONE SODIUM PHOSPHATE 10 MG/ML IJ SOLN
INTRAMUSCULAR | Status: DC | PRN
  Administered 2018-03-22: 10 mg via INTRAVENOUS

## 2018-03-22 MED ORDER — POLYETHYLENE GLYCOL 3350 17 G PO PACK: 17.0000 g | PACK | Freq: Every day | ORAL | Status: DC | PRN

## 2018-03-22 MED ORDER — FENTANYL CITRATE (PF) 250 MCG/5ML IJ SOLN
INTRAMUSCULAR | Status: AC
  Filled 2018-03-22: qty 5

## 2018-03-22 MED ORDER — FLEET ENEMA 7-19 GM/118ML RE ENEM: 1.0000 | ENEMA | Freq: Once | RECTAL | Status: DC | PRN

## 2018-03-22 MED ORDER — ASPIRIN 81 MG PO CHEW: 81.0000 mg | CHEWABLE_TABLET | Freq: Every day | ORAL | Status: DC

## 2018-03-22 MED ORDER — PHENOL 1.4 % MT LIQD: 1.0000 | OROMUCOSAL | Status: DC | PRN

## 2018-03-22 MED ORDER — HYDROCODONE-ACETAMINOPHEN 5-325 MG PO TABS: 1.0000 | ORAL_TABLET | ORAL | Status: DC | PRN

## 2018-03-22 MED ORDER — ROCURONIUM BROMIDE 100 MG/10ML IV SOLN
INTRAVENOUS | Status: DC | PRN
  Administered 2018-03-22: 50 mg via INTRAVENOUS

## 2018-03-22 MED ORDER — FENTANYL CITRATE (PF) 100 MCG/2ML IJ SOLN
INTRAMUSCULAR | Status: AC
Start: 2018-03-22 — End: ?
  Filled 2018-03-22: qty 2

## 2018-03-22 MED ORDER — MIDAZOLAM HCL 2 MG/2ML IJ SOLN
INTRAMUSCULAR | Status: AC
  Filled 2018-03-22: qty 2

## 2018-03-22 MED ORDER — HEMOSTATIC AGENTS (NO CHARGE) OPTIME
TOPICAL | Status: DC | PRN
  Administered 2018-03-22: 1

## 2018-03-22 MED ORDER — ATENOLOL 25 MG PO TABS
25.0000 mg | ORAL_TABLET | ORAL | Status: AC
  Administered 2018-03-22: 25 mg via ORAL
  Filled 2018-03-22 (×3): qty 1

## 2018-03-22 MED ORDER — ACETAMINOPHEN 650 MG RE SUPP: 650.0000 mg | RECTAL | Status: DC | PRN

## 2018-03-22 MED ORDER — MIDAZOLAM HCL 5 MG/5ML IJ SOLN
INTRAMUSCULAR | Status: DC | PRN
  Administered 2018-03-22: 2 mg via INTRAVENOUS

## 2018-03-22 MED ORDER — ONDANSETRON HCL 4 MG/2ML IJ SOLN: 4.0000 mg | Freq: Four times a day (QID) | INTRAMUSCULAR | Status: DC | PRN

## 2018-03-22 MED ORDER — DOCUSATE SODIUM 100 MG PO CAPS
100.0000 mg | ORAL_CAPSULE | Freq: Two times a day (BID) | ORAL | Status: DC
  Administered 2018-03-22: 100 mg via ORAL
  Filled 2018-03-22: qty 1

## 2018-03-22 MED ORDER — SODIUM CHLORIDE 0.9 % IV SOLN: 250.0000 mL | INTRAVENOUS | Status: DC

## 2018-03-22 MED ORDER — MEPERIDINE HCL 50 MG/ML IJ SOLN: 6.2500 mg | INTRAMUSCULAR | Status: DC | PRN

## 2018-03-22 MED ORDER — FAMOTIDINE IN NACL 20-0.9 MG/50ML-% IV SOLN: 20.0000 mg | Freq: Two times a day (BID) | INTRAVENOUS | Status: DC

## 2018-03-22 MED ORDER — METHYLPREDNISOLONE ACETATE 80 MG/ML IJ SUSP
INTRAMUSCULAR | Status: AC
  Filled 2018-03-22: qty 1

## 2018-03-22 MED ORDER — OXYCODONE HCL 5 MG PO TABS
5.0000 mg | ORAL_TABLET | ORAL | Status: DC | PRN
  Administered 2018-03-22 (×2): 5 mg via ORAL
  Filled 2018-03-22: qty 1

## 2018-03-22 MED ORDER — ATORVASTATIN CALCIUM 20 MG PO TABS
10.0000 mg | ORAL_TABLET | Freq: Every day | ORAL | Status: DC
  Administered 2018-03-22: 10 mg via ORAL
  Filled 2018-03-22: qty 1

## 2018-03-22 MED ORDER — ROPINIROLE HCL 1 MG PO TABS
0.5000 mg | ORAL_TABLET | Freq: Every day | ORAL | Status: DC
  Administered 2018-03-22: 0.5 mg via ORAL
  Filled 2018-03-22: qty 1

## 2018-03-22 MED ORDER — BUPIVACAINE HCL (PF) 0.5 % IJ SOLN
INTRAMUSCULAR | Status: DC | PRN
  Administered 2018-03-22: 5 mL

## 2018-03-22 MED ORDER — RAMIPRIL 2.5 MG PO CAPS
2.5000 mg | ORAL_CAPSULE | Freq: Every day | ORAL | Status: DC
  Administered 2018-03-22: 2.5 mg via ORAL
  Filled 2018-03-22 (×2): qty 1

## 2018-03-22 MED ORDER — ONDANSETRON HCL 4 MG PO TABS: 4.0000 mg | ORAL_TABLET | Freq: Four times a day (QID) | ORAL | Status: DC | PRN

## 2018-03-22 MED ORDER — CEFAZOLIN SODIUM-DEXTROSE 2-4 GM/100ML-% IV SOLN
2.0000 g | INTRAVENOUS | Status: AC
  Administered 2018-03-22: 2 g via INTRAVENOUS

## 2018-03-22 MED ORDER — METHOCARBAMOL 1000 MG/10ML IJ SOLN
500.0000 mg | Freq: Four times a day (QID) | INTRAVENOUS | Status: DC | PRN
  Filled 2018-03-22: qty 5

## 2018-03-22 MED ORDER — ADULT MULTIVITAMIN W/MINERALS CH: 1.0000 | ORAL_TABLET | Freq: Every day | ORAL | Status: DC

## 2018-03-22 MED ORDER — ZOLPIDEM TARTRATE 5 MG PO TABS: 5.0000 mg | ORAL_TABLET | Freq: Every evening | ORAL | Status: DC | PRN

## 2018-03-22 MED ORDER — MENTHOL 3 MG MT LOZG
1.0000 | LOZENGE | OROMUCOSAL | Status: DC | PRN
  Filled 2018-03-22: qty 9

## 2018-03-22 MED ORDER — CHLORHEXIDINE GLUCONATE CLOTH 2 % EX PADS: 6.0000 | MEDICATED_PAD | Freq: Once | CUTANEOUS | Status: DC

## 2018-03-22 MED ORDER — POLYETHYL GLYCOL-PROPYL GLYCOL 0.4-0.3 % OP SOLN: Freq: Every day | OPHTHALMIC | Status: DC | PRN

## 2018-03-22 MED ORDER — CEFAZOLIN SODIUM-DEXTROSE 2-4 GM/100ML-% IV SOLN
INTRAVENOUS | Status: AC
  Filled 2018-03-22: qty 100

## 2018-03-22 MED ORDER — HYDROMORPHONE HCL 1 MG/ML IJ SOLN: 0.2500 mg | INTRAMUSCULAR | Status: DC | PRN

## 2018-03-22 MED ORDER — LACTATED RINGERS IV SOLN
INTRAVENOUS | Status: DC
  Administered 2018-03-22: 12:00:00 via INTRAVENOUS

## 2018-03-22 MED ORDER — DEXAMETHASONE SODIUM PHOSPHATE 10 MG/ML IJ SOLN
INTRAMUSCULAR | Status: AC
  Filled 2018-03-22: qty 1

## 2018-03-22 MED ORDER — LIDOCAINE HCL (CARDIAC) PF 100 MG/5ML IV SOSY
PREFILLED_SYRINGE | INTRAVENOUS | Status: DC | PRN
  Administered 2018-03-22: 100 mg via INTRAVENOUS

## 2018-03-22 MED ORDER — SUGAMMADEX SODIUM 200 MG/2ML IV SOLN
INTRAVENOUS | Status: AC
  Filled 2018-03-22: qty 2

## 2018-03-22 MED ORDER — ACETAMINOPHEN 325 MG PO TABS: 650.0000 mg | ORAL_TABLET | ORAL | Status: DC | PRN

## 2018-03-22 MED ORDER — KCL IN DEXTROSE-NACL 20-5-0.45 MEQ/L-%-% IV SOLN: INTRAVENOUS | Status: DC

## 2018-03-22 MED ORDER — MONTELUKAST SODIUM 10 MG PO TABS
10.0000 mg | ORAL_TABLET | Freq: Every day | ORAL | Status: DC
  Administered 2018-03-23: 10 mg via ORAL
  Filled 2018-03-22: qty 1

## 2018-03-22 MED ORDER — CLOTRIMAZOLE 1 % EX CREA
TOPICAL_CREAM | Freq: Two times a day (BID) | CUTANEOUS | Status: DC
  Filled 2018-03-22: qty 15

## 2018-03-22 MED ORDER — ALUM & MAG HYDROXIDE-SIMETH 200-200-20 MG/5ML PO SUSP
30.0000 mL | Freq: Four times a day (QID) | ORAL | Status: DC | PRN
  Administered 2018-03-22: 30 mL via ORAL
  Filled 2018-03-22: qty 30

## 2018-03-22 MED ORDER — PAROXETINE HCL 20 MG PO TABS
20.0000 mg | ORAL_TABLET | Freq: Every day | ORAL | Status: DC
  Administered 2018-03-22: 20 mg via ORAL
  Filled 2018-03-22: qty 1

## 2018-03-22 MED ORDER — PROMETHAZINE HCL 25 MG/ML IJ SOLN: 6.2500 mg | INTRAMUSCULAR | Status: DC | PRN

## 2018-03-22 MED ORDER — ROCURONIUM BROMIDE 10 MG/ML (PF) SYRINGE
PREFILLED_SYRINGE | INTRAVENOUS | Status: AC
  Filled 2018-03-22: qty 10

## 2018-03-22 MED ORDER — POLYVINYL ALCOHOL 1.4 % OP SOLN
1.0000 [drp] | OPHTHALMIC | Status: DC | PRN
  Filled 2018-03-22: qty 15

## 2018-03-22 MED ORDER — METHOCARBAMOL 500 MG PO TABS
ORAL_TABLET | ORAL | Status: AC
  Filled 2018-03-22: qty 1

## 2018-03-22 MED ORDER — ONDANSETRON HCL 4 MG/2ML IJ SOLN
INTRAMUSCULAR | Status: DC | PRN
  Administered 2018-03-22: 4 mg via INTRAVENOUS

## 2018-03-22 MED ORDER — SUGAMMADEX SODIUM 200 MG/2ML IV SOLN
INTRAVENOUS | Status: DC | PRN
  Administered 2018-03-22: 200 mg via INTRAVENOUS

## 2018-03-22 MED ORDER — GEMFIBROZIL 600 MG PO TABS
600.0000 mg | ORAL_TABLET | Freq: Two times a day (BID) | ORAL | Status: DC
  Administered 2018-03-22: 600 mg via ORAL
  Filled 2018-03-22 (×2): qty 1

## 2018-03-22 MED ORDER — BISACODYL 10 MG RE SUPP: 10.0000 mg | Freq: Every day | RECTAL | Status: DC | PRN

## 2018-03-22 MED ORDER — BUPIVACAINE HCL (PF) 0.5 % IJ SOLN
INTRAMUSCULAR | Status: AC
  Filled 2018-03-22: qty 30

## 2018-03-22 MED ORDER — FENTANYL CITRATE (PF) 100 MCG/2ML IJ SOLN
INTRAMUSCULAR | Status: DC | PRN
  Administered 2018-03-22: 100 ug via INTRAVENOUS

## 2018-03-22 MED ORDER — OXYCODONE HCL 5 MG PO TABS
10.0000 mg | ORAL_TABLET | ORAL | Status: DC | PRN
  Administered 2018-03-22: 10 mg via ORAL
  Administered 2018-03-23: 5 mg via ORAL
  Filled 2018-03-22 (×2): qty 2

## 2018-03-22 MED ORDER — 0.9 % SODIUM CHLORIDE (POUR BTL) OPTIME
TOPICAL | Status: DC | PRN
  Administered 2018-03-22: 1000 mL

## 2018-03-22 MED ORDER — LIDOCAINE 2% (20 MG/ML) 5 ML SYRINGE
INTRAMUSCULAR | Status: AC
  Filled 2018-03-22: qty 5

## 2018-03-22 MED ORDER — THROMBIN 5000 UNITS EX SOLR
CUTANEOUS | Status: AC
  Filled 2018-03-22: qty 10000

## 2018-03-22 MED ORDER — LIDOCAINE-EPINEPHRINE 1 %-1:100000 IJ SOLN
INTRAMUSCULAR | Status: AC
  Filled 2018-03-22: qty 1

## 2018-03-22 MED ORDER — CEFAZOLIN SODIUM-DEXTROSE 2-4 GM/100ML-% IV SOLN
2.0000 g | Freq: Three times a day (TID) | INTRAVENOUS | Status: AC
  Administered 2018-03-22 – 2018-03-23 (×2): 2 g via INTRAVENOUS
  Filled 2018-03-22 (×2): qty 100

## 2018-03-22 MED ORDER — SODIUM CHLORIDE 0.9% FLUSH: 3.0000 mL | INTRAVENOUS | Status: DC | PRN

## 2018-03-22 MED ORDER — METHOCARBAMOL 500 MG PO TABS
500.0000 mg | ORAL_TABLET | Freq: Four times a day (QID) | ORAL | Status: DC | PRN
  Administered 2018-03-22 – 2018-03-23 (×3): 500 mg via ORAL
  Filled 2018-03-22 (×2): qty 1

## 2018-03-22 MED ORDER — METHYLPREDNISOLONE ACETATE 80 MG/ML IJ SUSP
INTRAMUSCULAR | Status: DC | PRN
  Administered 2018-03-22: 80 mg

## 2018-03-22 MED ORDER — LIDOCAINE-EPINEPHRINE 1 %-1:100000 IJ SOLN
INTRAMUSCULAR | Status: DC | PRN
  Administered 2018-03-22: 5 mL

## 2018-03-22 MED ORDER — FA-PYRIDOXINE-CYANOCOBALAMIN 2.5-25-2 MG PO TABS
1.0000 | ORAL_TABLET | Freq: Every day | ORAL | Status: DC
  Filled 2018-03-22 (×2): qty 1

## 2018-03-22 MED ORDER — EPHEDRINE SULFATE 50 MG/ML IJ SOLN
INTRAMUSCULAR | Status: DC | PRN
  Administered 2018-03-22: 10 mg via INTRAVENOUS

## 2018-03-22 MED ORDER — NABUMETONE 500 MG PO TABS
750.0000 mg | ORAL_TABLET | Freq: Two times a day (BID) | ORAL | Status: DC
  Filled 2018-03-22 (×2): qty 2

## 2018-03-22 MED ORDER — PROPOFOL 10 MG/ML IV BOLUS
INTRAVENOUS | Status: AC
  Filled 2018-03-22: qty 20

## 2018-03-22 SURGICAL SUPPLY — 57 items
BLADE CLIPPER SURG (BLADE) ×3 IMPLANT
BUR MATCHSTICK NEURO 3.0 LAGG (BURR) ×3 IMPLANT
BUR ROUND FLUTED 5 RND (BURR) ×2 IMPLANT
BUR ROUND FLUTED 5MM RND (BURR) ×1
CANISTER SUCT 3000ML PPV (MISCELLANEOUS) ×3 IMPLANT
CARTRIDGE OIL MAESTRO DRILL (MISCELLANEOUS) ×1 IMPLANT
DECANTER SPIKE VIAL GLASS SM (MISCELLANEOUS) ×3 IMPLANT
DERMABOND ADVANCED (GAUZE/BANDAGES/DRESSINGS) ×2
DERMABOND ADVANCED .7 DNX12 (GAUZE/BANDAGES/DRESSINGS) ×1 IMPLANT
DIFFUSER DRILL AIR PNEUMATIC (MISCELLANEOUS) ×3 IMPLANT
DRAPE LAPAROTOMY 100X72X124 (DRAPES) ×3 IMPLANT
DRAPE MICROSCOPE LEICA (MISCELLANEOUS) ×3 IMPLANT
DRAPE SURG 17X23 STRL (DRAPES) ×3 IMPLANT
DRSG OPSITE POSTOP 3X4 (GAUZE/BANDAGES/DRESSINGS) ×3 IMPLANT
DURAPREP 26ML APPLICATOR (WOUND CARE) ×3 IMPLANT
ELECT REM PT RETURN 9FT ADLT (ELECTROSURGICAL) ×3
ELECTRODE REM PT RTRN 9FT ADLT (ELECTROSURGICAL) ×1 IMPLANT
FLOSEAL 5ML (HEMOSTASIS) ×3 IMPLANT
GAUZE SPONGE 4X4 12PLY STRL (GAUZE/BANDAGES/DRESSINGS) IMPLANT
GAUZE SPONGE 4X4 16PLY XRAY LF (GAUZE/BANDAGES/DRESSINGS) IMPLANT
GLOVE BIO SURGEON STRL SZ8 (GLOVE) ×3 IMPLANT
GLOVE BIOGEL PI IND STRL 6.5 (GLOVE) ×1 IMPLANT
GLOVE BIOGEL PI IND STRL 7.5 (GLOVE) ×3 IMPLANT
GLOVE BIOGEL PI IND STRL 8 (GLOVE) ×2 IMPLANT
GLOVE BIOGEL PI IND STRL 8.5 (GLOVE) ×1 IMPLANT
GLOVE BIOGEL PI INDICATOR 6.5 (GLOVE) ×2
GLOVE BIOGEL PI INDICATOR 7.5 (GLOVE) ×6
GLOVE BIOGEL PI INDICATOR 8 (GLOVE) ×4
GLOVE BIOGEL PI INDICATOR 8.5 (GLOVE) ×2
GLOVE ECLIPSE 8.0 STRL XLNG CF (GLOVE) ×3 IMPLANT
GLOVE EXAM NITRILE LRG STRL (GLOVE) IMPLANT
GLOVE EXAM NITRILE XL STR (GLOVE) IMPLANT
GLOVE EXAM NITRILE XS STR PU (GLOVE) IMPLANT
GOWN STRL REUS W/ TWL LRG LVL3 (GOWN DISPOSABLE) ×1 IMPLANT
GOWN STRL REUS W/ TWL XL LVL3 (GOWN DISPOSABLE) ×1 IMPLANT
GOWN STRL REUS W/TWL 2XL LVL3 (GOWN DISPOSABLE) ×6 IMPLANT
GOWN STRL REUS W/TWL LRG LVL3 (GOWN DISPOSABLE) ×2
GOWN STRL REUS W/TWL XL LVL3 (GOWN DISPOSABLE) ×2
HEMOSTAT POWDER KIT SURGIFOAM (HEMOSTASIS) IMPLANT
KIT BASIN OR (CUSTOM PROCEDURE TRAY) ×3 IMPLANT
KIT TURNOVER KIT B (KITS) ×3 IMPLANT
NEEDLE HYPO 18GX1.5 BLUNT FILL (NEEDLE) ×3 IMPLANT
NEEDLE HYPO 25X1 1.5 SAFETY (NEEDLE) ×3 IMPLANT
NS IRRIG 1000ML POUR BTL (IV SOLUTION) ×3 IMPLANT
OIL CARTRIDGE MAESTRO DRILL (MISCELLANEOUS) ×3
PACK LAMINECTOMY NEURO (CUSTOM PROCEDURE TRAY) ×3 IMPLANT
PAD ARMBOARD 7.5X6 YLW CONV (MISCELLANEOUS) ×9 IMPLANT
RUBBERBAND STERILE (MISCELLANEOUS) ×6 IMPLANT
SPONGE SURGIFOAM ABS GEL SZ50 (HEMOSTASIS) IMPLANT
SUT VIC AB 0 CT1 18XCR BRD8 (SUTURE) ×1 IMPLANT
SUT VIC AB 0 CT1 8-18 (SUTURE) ×2
SUT VIC AB 2-0 CT1 18 (SUTURE) ×3 IMPLANT
SUT VIC AB 3-0 SH 8-18 (SUTURE) ×3 IMPLANT
SYR 5ML LL (SYRINGE) ×3 IMPLANT
TOWEL GREEN STERILE (TOWEL DISPOSABLE) ×3 IMPLANT
TOWEL GREEN STERILE FF (TOWEL DISPOSABLE) ×3 IMPLANT
WATER STERILE IRR 1000ML POUR (IV SOLUTION) ×3 IMPLANT

## 2018-03-22 NOTE — H&P (Signed)
Patient ID:   [id number] Patient: Clayton Braun  Date of Birth: Nov 25, 1950 Visit Type: Office Visit   Date: 03/11/2018 08:30 AM Provider: Marchia Meiers. Vertell Limber MD   This 67 year old male presents for back pain.  HISTORY OF PRESENT ILLNESS:  1.  back pain  Clayton Braun, 67 year old retired male, visits for evaluation of left hip and leg pain.   Bilateral lower extremity numbness and tingling nightly.  Patient recalls noticing severe lumbar and left leg pain that began after a game of golf mid May. Patient also notes experiencing restless legs at night that began months ago.  Symptoms increased requiring an ER visit and prednisone taper.  Prednisone taper offered no relief and ortho follow-up recommended epidural injections.   ESI x3 offered no relief  Prednisone taper offered no relief Nabumetone 750 mg b.i.d.  Gabapentin 300 mg offered no relief Tramadol 50 mg caused severe headaches, stopped Robaxin 500 mg offered no relief, stopped Norco 5/325 offered minimal relief, stopped  Followed by [facility], due for stress test (postponed for lumbar evaluation)  History:  Cataracts, arthritis, CAD, GERD, HTN, anxiety Surgical history:  Heart stent 2002, hernia repair, appendectomy, and penile prosthesis years ago  MRI uploaded [facility].  X-rays on [facility]          PAST MEDICAL/SURGICAL HISTORY:   (Detailed)    Disease/disorder Onset Date Management Date Comments    Heart surgery  CRR 02/26/2018 -    Sinus surgery  CRR 02/26/2018 -  Heart attack    CRR 02/26/2018 -    Appendectomy      Hernia repair    Anxiety      Arthritis      Depression      Gerd         Family History:  (Detailed) Relationship Family Member Name Deceased Age at Death Condition Onset Age Cause of Death      Family history of Hypertension  N      Family history of Seizure disorder  N     Social History:  (Detailed) Tobacco use reviewed. Preferred language is [place].   Tobacco use status:  Cigarette smoker. Smoking status: Current every day smoker.  SMOKING STATUS Type Smoking Status Usage Per Day Years Used Total Pack Years  Cigarette Current every day smoker  31    TOBACCO CESSATION INFORMATION Date Counseled By Order Status Description Code Tobacco Cessation Information  03/11/2018 Anderson Malta L. Beatty Tobacco cessation counseling completed   Smoking cessation education       MEDICATIONS: (added, continued or stopped this visit) Started Medication Directions Instruction Stopped   Aspir-81 81 mg tablet,delayed release take 1 tablet by oral route  every day     atenolol 25 mg tablet take 1 tablet by oral route  every bedtime     atorvastatin 10 mg tablet take 1 tablet by oral route  every day     desloratadine 5 mg tablet take 1 tablet by oral route  every day     esomeprazole magnesium 40 mg capsule,delayed release take 1 capsule by oral route  every day     gemfibrozil 600 mg tablet take 1 tablet by oral route 2 times every day     hydrocodone 5 mg-acetaminophen 325 mg tablet take 1 tablet by oral route  every 6 hours as needed for pain  03/11/2018  03/11/2018 hydrocodone 5 mg-acetaminophen 325 mg tablet take 1 tablet by oral route  every 6 hours as needed for pain as  needed     methocarbamol 750 mg tablet take 1 tablet by oral route 3 times every day  03/11/2018  03/11/2018 methocarbamol 750 mg tablet take 1 tablet by oral route 3 times every day as needed     nabumetone 750 mg tablet take 1 tablet by oral route 2 times every day     paroxetine 20 mg tablet take 1 tablet by oral route  every morning     ramipril 2.5 mg capsule take 1 capsule by oral route  every day    03/11/2018 ropinirole 0.25 mg tablet take 1 tablet by mouth at bedtime x 2days, then 2 tabs at bedtime x5days, then 4 tabs (1mg ) at bedtime nightly     roprinirole 0.25 daily days 1 -2, then 0.5 mg days 3 - 7, then 1 mg daily for restless legs       ALLERGIES: Ingredient Reaction Medication Name  [place]   NIACINAMIDE  NIACIN    Reviewed, no changes.   REVIEW OF SYSTEMS   See scanned patient registration form, dated, signed and dated on   Review of Systems Details System Neg/Pos Details  Constitutional Negative Chills, Fatigue, Fever, Malaise, Night sweats, Weight gain and Weight loss.  ENMT Negative Ear drainage, Hearing loss, Nasal drainage, Otalgia, Sinus pressure and Sore throat.  Eyes Negative Eye discharge, Eye pain and Vision changes.  Respiratory Negative Chronic cough, Cough, Dyspnea, Known TB exposure and Wheezing.  Cardio Negative Chest pain, Claudication, Edema and Irregular heartbeat/palpitations.  GI Negative Abdominal pain, Blood in stool, Change in stool pattern, Constipation, Decreased appetite, Diarrhea, Heartburn, Nausea and Vomiting.  GU Negative Dribbling, Dysuria, Erectile dysfunction, Hematuria, Polyuria (Genitourinary), Slow stream, Urinary frequency, Urinary incontinence and Urinary retention.  Endocrine Negative Cold intolerance, Heat intolerance, Polydipsia and Polyphagia.  Neuro Positive Extremity weakness, Numbness in extremity.  Psych Positive Anxiety.  Integumentary Negative Brittle hair, Brittle nails, Change in shape/size of mole(s), Hair loss, Hirsutism, Hives, Pruritus, Rash and Skin lesion.  MS Positive Back pain, LLE pain.  Hema/Lymph Negative Easy bleeding, Easy bruising and Lymphadenopathy.  Allergic/Immuno Negative Contact allergy, Environmental allergies, Food allergies and Seasonal allergies.  Reproductive Negative Penile discharge and Sexual dysfunction.   PHYSICAL EXAM:   Vitals Date Temp F BP Pulse Ht In Wt Lb BMI BSA Pain Score  03/11/2018  113/70 81 66 168 27.12  6/10    PHYSICAL EXAM Details General Level of Distress: no acute distress Overall Appearance: normal  Head and Face  Right Left  Fundoscopic Exam:  normal normal    Cardiovascular Cardiac: regular rate and  rhythm without murmur  Right Left  Carotid Pulses: normal normal  Respiratory Lungs: clear to auscultation  Neurological Orientation: normal Recent and Remote Memory: normal Attention Span and Concentration:   normal Language: normal Fund of Knowledge: normal  Right Left Sensation: normal normal Upper Extremity Coordination: normal normal  Lower Extremity Coordination: normal normal  Musculoskeletal Gait and Station: normal  Right Left Upper Extremity Muscle Strength: normal normal Lower Extremity Muscle Strength: normal normal Upper Extremity Muscle Tone:  normal normal Lower Extremity Muscle Tone: normal normal   Motor Strength Upper and lower extremity motor strength was tested in the clinically pertinent muscles. Any abnormal findings will be noted below.   Right Left Hip Flexor:  normal Knee Extensor:  normal Tib Anterior:  4-/5 EHL:  4-/5   Deep Tendon Reflexes  Right Left Biceps: normal normal Triceps: normal normal Brachioradialis:  normal normal Patellar: normal normal Achilles: normal normal  Cranial Nerves II. Optic Nerve/Visual Fields: normal III. Oculomotor: normal IV. Trochlear: normal V. Trigeminal: normal VI. Abducens: normal VII. Facial: normal VIII. Acoustic/Vestibular: normal IX. Glossopharyngeal: normal X. Vagus: normal XI. Spinal Accessory: normal XII. Hypoglossal: normal  Motor and other Tests Lhermittes: negative Rhomberg: negative Pronator drift: absent     Right Left Hoffman's: normal normal Clonus: normal normal Babinski: normal normal SLR:  positive at 20 degrees   Additional Findings:   Midline low back pain and left sciatic notch pain on palpation. When leaning forward shooting pain to left leg. 4- EHL left. Dorsiflexion 4 on left. Reflexes symmetric. Positive SLR at 20 degrees on left. Hip adductor 4- on left. Hip adductor nl on right side. Dx of radiculopathy.   DIAGNOSTIC RESULTS:   Leftward disc extrusion at  L4-5 causing no spinal canal stenosis and moderate right and no left neural foraminal narrowing at that level. The disc extrusion may cause nerve impingement on the nerve exiting on the left at L5-S1. Suspected pars defects at L5. Moderate right and no left neural foraminal narrowing at L5-S1. No other significant areas of spinal canal stenosis or neural foraminal narrowing are noted.     IMPRESSION:   MRI scan reveals a ruptured disc between L4 and L5. Degeneration noted below ruptured disc. Disc material on left side compressing nerve. Minor stenosis and slight scoliosis noted. Spurring on left at L3 and L4 and spurring on the right at L1 and L2. Retrolisthesis of L3-4, L4-5, L5-S1. Midline low back pain and left sciatic notch pain on palpation. When leaning forward shooting pain to left leg. Able to stand on heels and toes, nl dip on each side. Cranial nerve exam nl, Upper extremity strength nl, 4- EHL left. Dorsiflexion 4 on left. Reflexes symmetric. Positive SLR at 20 degrees on left. Hip adductor 4- on left. Hip adductor nl on right side. Dx of radiculopathy due to herniated lumbar disc at the L4-5 level on the left.  PLAN:  1.) Smoking cessation recommended 2.) Recommending to stop taking BC powder 3.) Hydrocodone prescription refilled 4.) Left L4-5 Microdiscectomy at [facility] 03/22/18 4.)Follow-up   Orders: Diagnostic Procedures: Assessment Procedure  M54.16 Lumbar Spine- AP/Lat/Obls/Spot/Flex/Ex  Instruction(s)/Education: Assessment Instruction   Tobacco cessation counseling  Z68.27 Dietary management education, guidance, and counseling   Completed Orders (this encounter) Order Details Reason Side Interpretation Result Initial Treatment Date Region  Lumbar Spine- AP/Lat/Obls/Spot/Flex/Ex      03/11/2018 All Levels to All Levels  Tobacco cessation counseling         Dietary management education, guidance, and counseling patient encouraged to eat a well balanced diet          Assessment/Plan   # Detail Type Description   1. Assessment Lumbar radiculopathy (M54.16).       2. Assessment Herniated nucleus pulposus, lumbar (M51.26).       3. Assessment Body mass index (BMI) 27.0-27.9, adult (O27.03).   Plan Orders Today's instructions / counseling include(s) Dietary management education, guidance, and counseling. Clinical information/comments: patient encouraged to eat a well balanced diet.         Pain Management Plan Pain Scale: 6/10. Method: Numeric Pain Intensity Scale. Location: back. Onset: 01/29/2018. Duration: varies. Quality: discomforting. Pain management follow-up plan of care: Patient is taking OTC pain relievers for relief.Marland Kitchen     MEDICATIONS PRESCRIBED TODAY    Rx Quantity Refills  ROPINIROLE HCL 0.25 mg  120 0  METHOCARBAMOL 750 mg  60 2  HYDROCODONE-ACETAMINOPHEN 5 mg-325 mg  60 0            Provider:  Marchia Meiers. Vertell Limber MD  03/11/2018 10:30 AM Dictation edited by: Dionne Bucy    CC Providers: Otho Darner  [address] [place] [address]-               Electronically signed by Marchia Meiers. Vertell Limber MD on 03/16/2018 12:59 PM

## 2018-03-22 NOTE — Interval H&P Note (Signed)
History and Physical Interval Note:  03/22/2018 1:45 PM  Clayton Braun  has presented today for surgery, with the diagnosis of Herniated nucleus pulposus, Lumbar  The various methods of treatment have been discussed with the patient and family. After consideration of risks, benefits and other options for treatment, the patient has consented to  Procedure(s) with comments: Left Lumbar 4-5 Microdiscectomy (Left) - Left Lumbar 4-5 Microdiscectomy as a surgical intervention .  The patient's history has been reviewed, patient examined, no change in status, stable for surgery.  I have reviewed the patient's chart and labs.  Questions were answered to the patient's satisfaction.     [name] D

## 2018-03-22 NOTE — Brief Op Note (Signed)
03/22/2018  3:45 PM  PATIENT:  Clayton Braun  67 y.o. male  PRE-OPERATIVE DIAGNOSIS:  Herniated nucleus pulposus, Lumbar, stenosis, DDD, spondylosis, radiculopathy L 45 left  POST-OPERATIVE DIAGNOSIS: Herniated nucleus pulposus, Lumbar, stenosis, DDD, spondylosis, radiculopathy L 45 left   PROCEDURE:  Procedure(s): Left Lumbar four-five Microdiscectomy (Left)  SURGEON:  Surgeon(s) and Role:    Erline Levine, MD - Primary  PHYSICIAN ASSISTANT:   ASSISTANTS: Poteat, RN   ANESTHESIA:   general  EBL:  40 mL   BLOOD ADMINISTERED:none  DRAINS: none   LOCAL MEDICATIONS USED:  MARCAINE    and LIDOCAINE   SPECIMEN:  No Specimen  DISPOSITION OF SPECIMEN:  N/A  COUNTS:  YES  TOURNIQUET:  * No tourniquets in log *  DICTATION: Patient has an L 45 disc rupture on the left with significant left leg weakness. It was elected to take him to surgery for left L 45 microdiscectomy.  Procedure: Patient was brought to the operating room and following the smooth and uncomplicated induction of general endotracheal anesthesia he was placed in a prone position on the Wilson frame. Low back was prepped and draped in the usual sterile fashion with betadine scrub and DuraPrep. Preoperative localizing X ray was obtained with a spinal needle.  Area of planned incision was infiltrated with local lidocaine. Incision was made in the midline and carried to the lumbodorsal fascia which was incised on the left side of midline. Subperiosteal dissection was performed exposing what was felt to be L 45 level. Intraoperative x-ray demonstrated marker probe at L 45.  A hemi-semi-laminectomy of L 4 was performed a high-speed drill and completed with Kerrison rongeurs and a generous foraminotomy was performed overlying the superior aspect of the L 5 lamina. Ligamentum flavum was detached and removed in a piecemeal fashion and the L 5 nerve root was decompressed laterally with removal of the superior aspect of the facet  and ligamentum causing nerve root compression. The microscope was brought into the field and the L 5 nerve root was mobilized medially. This exposed caudally migrated disc material directly compressing the left L 5 nerve root. Multiple fragments were removed. Redundant annulus was removed with a 2 mm Kerrison, but the interspace itself did not seem to be violated, nor did the annulus appear softened.  It was therefore elected to not further decompress the interspace and remove additional disc material.  At this point it was felt that all neural elements were well decompressed and there was no evidence of residual loose disc material. The laminectomy defect was then irrigated with saline and no additional disc material was mobilized. Hemostasis was assured with bipolar electrocautery and Floseal and the interspace was irrigated with Depo-Medrol and fentanyl. The lumbodorsal fascia was closed with 0 Vicryl sutures the subcutaneous tissues reapproximated 2-0 Vicryl inverted sutures and the skin edges were reapproximated with 3-0 Vicryl subcuticular stitch. The wound is dressed with Dermabond and an occlusive dressing. Patient was extubated in the operating room and taken to recovery in stable and satisfactory condition having tolerated his operation well counts were correct at the end of the case.   PLAN OF CARE: Admit to inpatient   PATIENT DISPOSITION:  PACU - hemodynamically stable.   Delay start of Pharmacological VTE agent (>24hrs) due to surgical blood loss or risk of bleeding: yes

## 2018-03-22 NOTE — Transfer of Care (Signed)
Immediate Anesthesia Transfer of Care Note  Patient: Clayton Braun  Procedure(s) Performed: Left Lumbar four-five Microdiscectomy (Left Spine Lumbar)  Patient Location: PACU  Anesthesia Type:General  Level of Consciousness: awake, alert , oriented and patient cooperative  Airway & Oxygen Therapy: Patient Spontanous Breathing  Post-op Assessment: Report given to RN and Post -op Vital signs reviewed and stable  Post vital signs: Reviewed and stable  Last Vitals:  Vitals Value Taken Time  BP 107/66 03/22/2018  3:59 PM  Temp    Pulse 71 03/22/2018  4:03 PM  Resp 15 03/22/2018  4:03 PM  SpO2 98 % 03/22/2018  4:03 PM  Vitals shown include unvalidated device data.  Last Pain:  Vitals:   03/22/18 1212  TempSrc:   PainSc: 4       Patients Stated Pain Goal: 3 (34/14/43 6016)  Complications: No apparent anesthesia complications

## 2018-03-22 NOTE — Anesthesia Preprocedure Evaluation (Signed)
Anesthesia Evaluation  Patient identified by MRN, date of birth, ID band Patient awake    Reviewed: Allergy & Precautions, NPO status , Patient's Chart, lab work & pertinent test results, reviewed documented beta blocker date and time   Airway Mallampati: II  TM Distance: >3 FB Neck ROM: Full    Dental no notable dental hx.    Pulmonary Current Smoker,    Pulmonary exam normal breath sounds clear to auscultation       Cardiovascular hypertension, Pt. on home beta blockers and Pt. on medications + CAD  Normal cardiovascular exam Rhythm:Regular Rate:Normal     Neuro/Psych PSYCHIATRIC DISORDERS Anxiety Depression negative neurological ROS     GI/Hepatic negative GI ROS, Neg liver ROS,   Endo/Other  negative endocrine ROS  Renal/GU negative Renal ROS     Musculoskeletal  (+) Arthritis ,   Abdominal   Peds  Hematology negative hematology ROS (+)   Anesthesia Other Findings   Reproductive/Obstetrics negative OB ROS                             Anesthesia Physical Anesthesia Plan  ASA: III  Anesthesia Plan: General   Post-op Pain Management:    Induction: Intravenous  PONV Risk Score and Plan: 2 and Ondansetron, Dexamethasone and Treatment may vary due to age or medical condition  Airway Management Planned: Oral ETT  Additional Equipment:   Intra-op Plan:   Post-operative Plan: Extubation in OR  Informed Consent: I have reviewed the patients History and Physical, chart, labs and discussed the procedure including the risks, benefits and alternatives for the proposed anesthesia with the patient or authorized representative who has indicated his/her understanding and acceptance.   Dental advisory given  Plan Discussed with: CRNA  Anesthesia Plan Comments:         Anesthesia Quick Evaluation

## 2018-03-22 NOTE — Op Note (Signed)
03/22/2018  3:45 PM  PATIENT:  Clayton Braun  67 y.o. male  PRE-OPERATIVE DIAGNOSIS:  Herniated nucleus pulposus, Lumbar, stenosis, DDD, spondylosis, radiculopathy L 45 left  POST-OPERATIVE DIAGNOSIS: Herniated nucleus pulposus, Lumbar, stenosis, DDD, spondylosis, radiculopathy L 45 left   PROCEDURE:  Procedure(s): Left Lumbar four-five Microdiscectomy (Left)  SURGEON:  Surgeon(s) and Role:    Erline Levine, MD - Primary  PHYSICIAN ASSISTANT:   ASSISTANTS: Poteat, RN   ANESTHESIA:   general  EBL:  40 mL   BLOOD ADMINISTERED:none  DRAINS: none   LOCAL MEDICATIONS USED:  MARCAINE    and LIDOCAINE   SPECIMEN:  No Specimen  DISPOSITION OF SPECIMEN:  N/A  COUNTS:  YES  TOURNIQUET:  * No tourniquets in log *  DICTATION: Patient has an L 45 disc rupture on the left with significant left leg weakness. It was elected to take him to surgery for left L 45 microdiscectomy.  Procedure: Patient was brought to the operating room and following the smooth and uncomplicated induction of general endotracheal anesthesia he was placed in a prone position on the Wilson frame. Low back was prepped and draped in the usual sterile fashion with betadine scrub and DuraPrep. Preoperative localizing X ray was obtained with a spinal needle.  Area of planned incision was infiltrated with local lidocaine. Incision was made in the midline and carried to the lumbodorsal fascia which was incised on the left side of midline. Subperiosteal dissection was performed exposing what was felt to be L 45 level. Intraoperative x-ray demonstrated marker probe at L 45.  A hemi-semi-laminectomy of L 4 was performed a high-speed drill and completed with Kerrison rongeurs and a generous foraminotomy was performed overlying the superior aspect of the L 5 lamina. Ligamentum flavum was detached and removed in a piecemeal fashion and the L 5 nerve root was decompressed laterally with removal of the superior aspect of the facet  and ligamentum causing nerve root compression. The microscope was brought into the field and the L 5 nerve root was mobilized medially. This exposed caudally migrated disc material directly compressing the left L 5 nerve root. Multiple fragments were removed. Redundant annulus was removed with a 2 mm Kerrison, but the interspace itself did not seem to be violated, nor did the annulus appear softened.  It was therefore elected to not further decompress the interspace and remove additional disc material.  At this point it was felt that all neural elements were well decompressed and there was no evidence of residual loose disc material. The laminectomy defect was then irrigated with saline and no additional disc material was mobilized. Hemostasis was assured with bipolar electrocautery and Floseal and the interspace was irrigated with Depo-Medrol and fentanyl. The lumbodorsal fascia was closed with 0 Vicryl sutures the subcutaneous tissues reapproximated 2-0 Vicryl inverted sutures and the skin edges were reapproximated with 3-0 Vicryl subcuticular stitch. The wound is dressed with Dermabond and an occlusive dressing. Patient was extubated in the operating room and taken to recovery in stable and satisfactory condition having tolerated his operation well counts were correct at the end of the case.   PLAN OF CARE: Admit to inpatient   PATIENT DISPOSITION:  PACU - hemodynamically stable.   Delay start of Pharmacological VTE agent (>24hrs) due to surgical blood loss or risk of bleeding: yes

## 2018-03-22 NOTE — Progress Notes (Signed)
Awake, alert, conversant.  Full strength bilateral PF/EHL/DF.  Doing well.

## 2018-03-23 DIAGNOSIS — M549 Dorsalgia, unspecified: Secondary | ICD-10-CM | POA: Diagnosis not present

## 2018-03-23 DIAGNOSIS — I2510 Atherosclerotic heart disease of native coronary artery without angina pectoris: Secondary | ICD-10-CM | POA: Diagnosis not present

## 2018-03-23 DIAGNOSIS — K219 Gastro-esophageal reflux disease without esophagitis: Secondary | ICD-10-CM | POA: Diagnosis not present

## 2018-03-23 DIAGNOSIS — I10 Essential (primary) hypertension: Secondary | ICD-10-CM | POA: Diagnosis not present

## 2018-03-23 DIAGNOSIS — M5116 Intervertebral disc disorders with radiculopathy, lumbar region: Secondary | ICD-10-CM | POA: Diagnosis not present

## 2018-03-23 DIAGNOSIS — Z8249 Family history of ischemic heart disease and other diseases of the circulatory system: Secondary | ICD-10-CM | POA: Diagnosis not present

## 2018-03-23 DIAGNOSIS — M47816 Spondylosis without myelopathy or radiculopathy, lumbar region: Secondary | ICD-10-CM | POA: Diagnosis not present

## 2018-03-23 MED ORDER — OXYCODONE HCL 5 MG PO TABS: ORAL_TABLET | ORAL | 0 refills | Status: DC

## 2018-03-23 MED ORDER — ALBUTEROL SULFATE (2.5 MG/3ML) 0.083% IN NEBU: 3.0000 mL | INHALATION_SOLUTION | RESPIRATORY_TRACT | Status: DC

## 2018-03-23 MED ORDER — ALBUTEROL SULFATE (2.5 MG/3ML) 0.083% IN NEBU
2.5000 mg | INHALATION_SOLUTION | RESPIRATORY_TRACT | Status: DC | PRN
Start: 2018-03-23 — End: 2018-03-23
  Administered 2018-03-23: 2.5 mg via RESPIRATORY_TRACT
  Filled 2018-03-23: qty 3

## 2018-03-23 MED ORDER — METHOCARBAMOL 500 MG PO TABS: 500.0000 mg | ORAL_TABLET | Freq: Four times a day (QID) | ORAL | 3 refills | Status: DC | PRN

## 2018-03-23 NOTE — Discharge Instructions (Signed)
Wound Care Leave incision open to air. You may shower. Do not scrub directly on incision.  Do not put any creams, lotions, or ointments on incision.  Activity Walk each and every day, increasing distance each day. No lifting greater than 5 lbs.  Avoid bending, arching, and twisting. No driving for 2 weeks; may ride as a passenger locally. If provided with back brace, wear when out of bed.  It is not necessary to wear in bed.  Diet Resume your normal diet.   Return to Work Will be discussed at you follow up appointment.  Call Your Doctor If Any of These Occur Redness, drainage, or swelling at the wound.  Temperature greater than 101 degrees. Severe pain not relieved by pain medication. Incision starts to come apart.  Follow Up Appt Call today for appointment in 3-4 weeks ([phone]) or for problems.    

## 2018-03-23 NOTE — Discharge Summary (Signed)
Physician Discharge Summary  Patient ID: Clayton Braun MRN: [id number] DOB/AGE: 69-Jun-1952 69 y.o.  Admit date: 03/22/2018 Discharge date: 03/23/2018  Admission Diagnoses: Herniated nucleus pulposus with radiculopathy  Discharge Diagnoses: Herniated nucleus pulposus with radiculopathy Active Problems:   Herniated lumbar disc without myelopathy   Discharged Condition: good  Hospital Course: Patient was admitted to undergo surgery which he tolerated well back and leg pain have been well recovered  Consults: None  Significant Diagnostic Studies: None  Treatments: surgery: Microdiscectomy L4-5 left  Discharge Exam: Blood pressure (!) 84/50, pulse 62, temperature 98.1 F (36.7 C), temperature source Oral, resp. rate 17, height 5' 6.5" (1.689 m), weight 76.2 kg (168 lb), SpO2 94 %. Incision is clean and dry motor function is intact in both lower extremities Station and gait is intact  Disposition: Discharge disposition: 01-Home or Self Care       Discharge Instructions    Call MD for:  redness, tenderness, or signs of infection (pain, swelling, redness, odor or green/yellow discharge around incision site)   Complete by:  As directed    Call MD for:  severe uncontrolled pain   Complete by:  As directed    Call MD for:  temperature >100.4   Complete by:  As directed    Diet - low sodium heart healthy   Complete by:  As directed    Increase activity slowly   Complete by:  As directed      Allergies as of 03/23/2018      Reactions   Niacin And Related Other (See Comments)   flushing      Medication List    TAKE these medications   ALPRAZolam 1 MG tablet Commonly known as:  XANAX Take 0.5 mg by mouth at bedtime.   aspirin 81 MG chewable tablet Chew 81 mg by mouth daily.   atenolol 25 MG tablet Commonly known as:  TENORMIN Take 25 mg by mouth daily.   atorvastatin 10 MG tablet Commonly known as:  LIPITOR Take 10 mg by mouth at bedtime.   CENTRUM SILVER  PO Take 1 tablet by mouth daily.   clotrimazole-betamethasone cream Commonly known as:  LOTRISONE Apply 1 application topically 2 (two) times daily.   FOLBIC 2.5-25-2 MG Tabs tablet Generic drug:  folic acid-pyridoxine-cyancobalamin Take 1 tablet by mouth Daily.   gemfibrozil 600 MG tablet Commonly known as:  LOPID Take 600 mg by mouth 2 (two) times daily.   HYDROcodone-acetaminophen 5-325 MG tablet Commonly known as:  NORCO/VICODIN Take 1 tablet by mouth every 6 (six) hours as needed for pain.   methocarbamol 500 MG tablet Commonly known as:  ROBAXIN Take 1 tablet (500 mg total) by mouth every 6 (six) hours as needed for muscle spasms.   montelukast 10 MG tablet Commonly known as:  SINGULAIR Take 10 mg by mouth at bedtime.   nabumetone 750 MG tablet Commonly known as:  RELAFEN Take 750 mg by mouth 2 (two) times daily.   oxyCODONE 5 MG immediate release tablet Commonly known as:  Oxy IR/ROXICODONE 1-2 tabs every 6 hrs as needed for  pain   pantoprazole 40 MG tablet Commonly known as:  PROTONIX Take 40 mg by mouth daily.   PARoxetine 20 MG tablet Commonly known as:  PAXIL Take 20 mg by mouth at bedtime.   ramipril 2.5 MG capsule Commonly known as:  ALTACE Take 2.5 mg by mouth daily.   rOPINIRole 0.25 MG tablet Commonly known as:  REQUIP Take 0.5 mg by mouth  at bedtime.   SYSTANE ULTRA OP Place 1 drop into both eyes daily as needed (dry eyes).        SignedEarleen Newport 03/23/2018, 8:49 AM

## 2018-03-23 NOTE — Evaluation (Signed)
Physical Therapy Evaluation Patient Details Name: Clayton Braun MRN: [id number] DOB: December 14, 1950 Today's Date: 03/23/2018   History of Present Illness  patient is a 67 yo male s/p Left Lumbar 4-5 Microdiscectomy   Clinical Impression  Patient seen for mobility assessment s/p spinal surgery. Mobilizing well. Educated patient on precautions, mobility expectations, safety and car transfers. No further acute PT needs. Will sign off.     Follow Up Recommendations No PT follow up    Equipment Recommendations  None recommended by PT    Recommendations for Other Services       Precautions / Restrictions Precautions Precautions: Back Precaution Booklet Issued: Yes (comment) Precaution Comments: verbally reviewed Restrictions Weight Bearing Restrictions: No      Mobility  Bed Mobility Overal bed mobility: Independent                Transfers Overall transfer level: Independent                  Ambulation/Gait Ambulation/Gait assistance: Independent Gait Distance (Feet): 360 Feet Assistive device: None Gait Pattern/deviations: WFL(Within Functional Limits)        Stairs Stairs: Yes Stairs assistance: Modified independent (Device/Increase time) Stair Management: One rail Right Number of Stairs: 2 General stair comments: no difficulty  Wheelchair Mobility    Modified Rankin (Stroke Patients Only)       Balance Overall balance assessment: Independent                                           Pertinent Vitals/Pain Pain Assessment: 0-10 Pain Score: 2  Pain Location: surgical site Pain Descriptors / Indicators: Sore Pain Intervention(s): Monitored during session    Home Living Family/patient expects to be discharged to:: Private residence Living Arrangements: Spouse/significant other Available Help at Discharge: Family Type of Home: House Home Access: Stairs to enter Entrance Stairs-Rails: None Entrance Stairs-Number of  Steps: 1 Home Layout: Two level;Able to live on main level with bedroom/bathroom Home Equipment: Shower seat      Prior Function Level of Independence: Independent               Hand Dominance   Dominant Hand: Right    Extremity/Trunk Assessment   Upper Extremity Assessment Upper Extremity Assessment: Overall WFL for tasks assessed    Lower Extremity Assessment Lower Extremity Assessment: Overall WFL for tasks assessed    Cervical / Trunk Assessment Cervical / Trunk Assessment: (s/p spinal surgery)  Communication   Communication: No difficulties  Cognition Arousal/Alertness: Awake/alert Behavior During Therapy: WFL for tasks assessed/performed Overall Cognitive Status: Within Functional Limits for tasks assessed                                        General Comments      Exercises     Assessment/Plan    PT Assessment Patent does not need any further PT services  PT Problem List         PT Treatment Interventions      PT Goals (Current goals can be found in the Care Plan section)  Acute Rehab PT Goals PT Goal Formulation: All assessment and education complete, DC therapy    Frequency     Barriers to discharge        Co-evaluation  AM-PAC PT "6 Clicks" Daily Activity  Outcome Measure Difficulty turning over in bed (including adjusting bedclothes, sheets and blankets)?: None Difficulty moving from lying on back to sitting on the side of the bed? : None Difficulty sitting down on and standing up from a chair with arms (e.g., wheelchair, bedside commode, etc,.)?: None Help needed moving to and from a bed to chair (including a wheelchair)?: None Help needed walking in hospital room?: None Help needed climbing 3-5 steps with a railing? : None 6 Click Score: 24    End of Session   Activity Tolerance: Patient tolerated treatment well Patient left: in bed;with call bell/phone within reach;with family/visitor  present Nurse Communication: Mobility status PT Visit Diagnosis: Difficulty in walking, not elsewhere classified (R26.2)    Time: 8295-6213 PT Time Calculation (min) (ACUTE ONLY): 16 min   Charges:   PT Evaluation $PT Eval Low Complexity: 1 Low     PT G Codes:        Alben Deeds, PT DPT  Board Certified Neurologic Specialist [phone]   Duncan Dull 03/23/2018, 7:51 AM

## 2018-03-23 NOTE — Anesthesia Postprocedure Evaluation (Signed)
Anesthesia Post Note  Patient: Clayton Braun  Procedure(s) Performed: Left Lumbar four-five Microdiscectomy (Left Spine Lumbar)     Patient location during evaluation: PACU Anesthesia Type: General Level of consciousness: sedated and patient cooperative Pain management: pain level controlled Vital Signs Assessment: post-procedure vital signs reviewed and stable Respiratory status: spontaneous breathing Cardiovascular status: stable Anesthetic complications: no    Last Vitals:  Vitals:   03/23/18 0722 03/23/18 1021  BP: (!) 84/50   Pulse: 62 (!) 58  Resp: 17 16  Temp:    SpO2: 94% 94%    Last Pain:  Vitals:   03/23/18 0614  TempSrc:   PainSc: [place]

## 2018-03-23 NOTE — Progress Notes (Signed)
Patient alert and oriented, mae's well, voiding adequate amount of urine, swallowing without difficulty, no c/o pain at time of discharge. Patient discharged home with family. Script and discharged instructions given to patient. Patient and family stated understanding of instructions given. Patient has an appointment with Dr.Stern    

## 2018-03-23 NOTE — Evaluation (Signed)
Occupational Therapy Evaluation Patient Details Name: Clayton Braun MRN: [id number] DOB: Dec 13, 1950 Today's Date: 03/23/2018    History of Present Illness Patient is a 67 yo male s/p Left Lumbar 4-5 Microdiscectomy. PMH significant for but not limited to: axiety, GERD, HTN, OA, cardiac cath.     Clinical Impression   PTA patient independent, currently independent to supervision for ADL and functional transfers with no AD.  Requires intermittent supervision to adhere to back precautions, but after education on compensatory techniques able to demonstrate adherence with independence. Educated on safety, precautions and compensatory techniques for increased participation in ADLs.  Patient has no further OT needs acutely.  Thank you for this referral.  OT signing off.     Follow Up Recommendations  No OT follow up;Supervision - Intermittent    Equipment Recommendations  None recommended by OT    Recommendations for Other Services       Precautions / Restrictions Precautions Precautions: Back Precaution Booklet Issued: Yes (comment)(PT issued in prior session) Precaution Comments: verbally reviewed precautions and handout with patient  Restrictions Weight Bearing Restrictions: No      Mobility Bed Mobility Overal bed mobility: Independent             General bed mobility comments: demonstrates independence with log rolling technique from flat bed  Transfers Overall transfer level: Independent                    Balance Overall balance assessment: No apparent balance deficits (not formally assessed)                                         ADL either performed or assessed with clinical judgement   ADL Overall ADL's : Needs assistance/impaired Eating/Feeding: Independent;Sitting   Grooming: Supervision/safety;Cueing for compensatory techniques;Standing   Upper Body Bathing: Modified independent;Sitting   Lower Body Bathing: Supervison/  safety;Sit to/from stand;Cueing for back precautions;Cueing for compensatory techniques Lower Body Bathing Details (indicate cue type and reason): educated on techniques and safety to adhere to back preacutions, agreealbe to complete seated initally Upper Body Dressing : Modified independent;Standing   Lower Body Dressing: Supervision/safety;Sit to/from stand;Cueing for compensatory techniques;Cueing for back precautions Lower Body Dressing Details (indicate cue type and reason): educated on compensatory techniques for back precautions, patient able to complete figure 4 technique without discomfort Toilet Transfer: Modified Independent;Grab Information systems manager Details (indicate cue type and reason): discussed back precautions and technique, able to use counter to support self at home Toileting- Water quality scientist and Hygiene: Supervision/safety;Sit to/from stand;Cueing for compensatory techniques;Adhering to back precautions Toileting - Clothing Manipulation Details (indicate cue type and reason): educated on techniques (squatting) for toileting needs Tub/ Shower Transfer: Walk-in shower;Modified independent;Ambulation;Shower Scientist, research (medical) Details (indicate cue type and reason): good safety and technique Functional mobility during ADLs: Independent General ADL Comments: educated on compensatory techniques, back precautions and pacing in order to ensure back protection and participation in [place]?: No apparent visual deficits     Perception     Praxis      Pertinent Vitals/Pain Pain Assessment: Faces Pain Score: 2  Faces Pain Scale: Hurts a little bit Pain Location: surgical site Pain Descriptors / Indicators: Sore Pain Intervention(s): Monitored during session     Hand Dominance Right   Extremity/Trunk Assessment Upper Extremity Assessment Upper Extremity Assessment: Overall [facility]  for tasks assessed   Lower Extremity  Assessment Lower Extremity Assessment: Defer to PT evaluation   Cervical / Trunk Assessment Cervical / Trunk Assessment: Other exceptions Cervical / Trunk Exceptions: s/p lumbar surgery   Communication Communication Communication: No difficulties   Cognition Arousal/Alertness: Awake/alert Behavior During Therapy: WFL for tasks assessed/performed Overall Cognitive Status: Within Functional Limits for tasks assessed                                     General Comments  son in law present     Exercises     Shoulder Instructions      Home Living Family/patient expects to be discharged to:: Private residence Living Arrangements: Spouse/significant other Available Help at Discharge: Family Type of Home: House Home Access: Stairs to enter Technical brewer of Steps: 1 Entrance Stairs-Rails: None Home Layout: Two level;Able to live on main level with bedroom/bathroom     Bathroom Shower/Tub: Walk-in shower;Door   [employer] Toilet: Standard     Home Equipment: Civil engineer, contracting          Prior Functioning/Environment Level of Independence: Independent                 OT Problem List: Decreased knowledge of precautions;Decreased knowledge of use of DME or AE      OT Treatment/Interventions:      OT Goals(Current goals can be found in the care plan section) Acute Rehab OT Goals Patient Stated Goal: back to golfing OT Goal Formulation: With patient  OT Frequency:     Barriers to D/C:            Co-evaluation              AM-PAC PT "6 Clicks" Daily Activity     Outcome Measure Help from another person eating meals?: None Help from another person taking care of personal grooming?: None Help from another person toileting, which includes using toliet, bedpan, or urinal?: None Help from another person bathing (including washing, rinsing, drying)?: None Help from another person to put on and taking off regular upper body clothing?:  None Help from another person to put on and taking off regular lower body clothing?: None 6 Click Score: 24   End of Session Nurse Communication: Mobility status  Activity Tolerance: Patient tolerated treatment well Patient left: (seated EOB)  OT Visit Diagnosis: Other abnormalities of gait and mobility (R26.89)                Time: 0277-4128 OT Time Calculation (min): 13 min Charges:  OT General Charges $OT Visit: 1 Visit OT Evaluation $OT Eval Low Complexity: 1 Low G-Codes:     Delight Stare, OTR/L  Pager [phone]   Delight Stare 03/23/2018, 8:46 AM

## 2018-03-24 ENCOUNTER — Encounter (HOSPITAL_COMMUNITY): Payer: Self-pay | Admitting: Neurosurgery

## 2018-05-15 DIAGNOSIS — E7800 Pure hypercholesterolemia, unspecified: Secondary | ICD-10-CM | POA: Diagnosis not present

## 2018-05-15 DIAGNOSIS — I2510 Atherosclerotic heart disease of native coronary artery without angina pectoris: Secondary | ICD-10-CM | POA: Diagnosis not present

## 2018-05-15 DIAGNOSIS — M5416 Radiculopathy, lumbar region: Secondary | ICD-10-CM | POA: Diagnosis not present

## 2018-05-15 DIAGNOSIS — M545 Low back pain: Secondary | ICD-10-CM | POA: Diagnosis not present

## 2018-05-15 DIAGNOSIS — M5126 Other intervertebral disc displacement, lumbar region: Secondary | ICD-10-CM | POA: Diagnosis not present

## 2018-06-10 DIAGNOSIS — M545 Low back pain: Secondary | ICD-10-CM | POA: Diagnosis not present

## 2018-06-10 DIAGNOSIS — M5126 Other intervertebral disc displacement, lumbar region: Secondary | ICD-10-CM | POA: Diagnosis not present

## 2018-06-10 DIAGNOSIS — M5416 Radiculopathy, lumbar region: Secondary | ICD-10-CM | POA: Diagnosis not present

## 2018-06-11 DIAGNOSIS — I2510 Atherosclerotic heart disease of native coronary artery without angina pectoris: Secondary | ICD-10-CM | POA: Diagnosis not present

## 2018-06-11 DIAGNOSIS — F419 Anxiety disorder, unspecified: Secondary | ICD-10-CM | POA: Diagnosis not present

## 2018-06-11 DIAGNOSIS — Z299 Encounter for prophylactic measures, unspecified: Secondary | ICD-10-CM | POA: Diagnosis not present

## 2018-06-11 DIAGNOSIS — Z6826 Body mass index (BMI) 26.0-26.9, adult: Secondary | ICD-10-CM | POA: Diagnosis not present

## 2018-06-11 DIAGNOSIS — I10 Essential (primary) hypertension: Secondary | ICD-10-CM | POA: Diagnosis not present

## 2018-06-11 DIAGNOSIS — Z23 Encounter for immunization: Secondary | ICD-10-CM | POA: Diagnosis not present

## 2018-08-28 DIAGNOSIS — J310 Chronic rhinitis: Secondary | ICD-10-CM | POA: Diagnosis not present

## 2018-08-28 DIAGNOSIS — I10 Essential (primary) hypertension: Secondary | ICD-10-CM | POA: Diagnosis not present

## 2018-08-28 DIAGNOSIS — Z299 Encounter for prophylactic measures, unspecified: Secondary | ICD-10-CM | POA: Diagnosis not present

## 2018-08-28 DIAGNOSIS — Z6827 Body mass index (BMI) 27.0-27.9, adult: Secondary | ICD-10-CM | POA: Diagnosis not present

## 2018-08-28 DIAGNOSIS — F17210 Nicotine dependence, cigarettes, uncomplicated: Secondary | ICD-10-CM | POA: Diagnosis not present

## 2018-09-05 DIAGNOSIS — I2510 Atherosclerotic heart disease of native coronary artery without angina pectoris: Secondary | ICD-10-CM | POA: Diagnosis not present

## 2018-09-05 DIAGNOSIS — E7800 Pure hypercholesterolemia, unspecified: Secondary | ICD-10-CM | POA: Diagnosis not present

## 2018-09-07 ENCOUNTER — Emergency Department (HOSPITAL_COMMUNITY)
Admission: EM | Admit: 2018-09-07 | Discharge: 2018-09-07 | Disposition: A | Discharge status: Home / Self Care | Payer: Medicare Other | Source: Home / Self Care | Attending: Emergency Medicine | Admitting: Emergency Medicine

## 2018-09-07 ENCOUNTER — Emergency Department (HOSPITAL_COMMUNITY): Payer: Medicare Other

## 2018-09-07 DIAGNOSIS — Y9301 Activity, walking, marching and hiking: Secondary | ICD-10-CM | POA: Insufficient documentation

## 2018-09-07 DIAGNOSIS — S7002XA Contusion of left hip, initial encounter: Secondary | ICD-10-CM | POA: Insufficient documentation

## 2018-09-07 DIAGNOSIS — I10 Essential (primary) hypertension: Secondary | ICD-10-CM | POA: Insufficient documentation

## 2018-09-07 DIAGNOSIS — F17210 Nicotine dependence, cigarettes, uncomplicated: Secondary | ICD-10-CM | POA: Insufficient documentation

## 2018-09-07 DIAGNOSIS — M5136 Other intervertebral disc degeneration, lumbar region: Secondary | ICD-10-CM

## 2018-09-07 DIAGNOSIS — Z85828 Personal history of other malignant neoplasm of skin: Secondary | ICD-10-CM

## 2018-09-07 DIAGNOSIS — M4726 Other spondylosis with radiculopathy, lumbar region: Secondary | ICD-10-CM | POA: Diagnosis not present

## 2018-09-07 DIAGNOSIS — Z79899 Other long term (current) drug therapy: Secondary | ICD-10-CM | POA: Insufficient documentation

## 2018-09-07 DIAGNOSIS — Y999 Unspecified external cause status: Secondary | ICD-10-CM | POA: Insufficient documentation

## 2018-09-07 DIAGNOSIS — W108XXA Fall (on) (from) other stairs and steps, initial encounter: Secondary | ICD-10-CM | POA: Insufficient documentation

## 2018-09-07 DIAGNOSIS — Z7982 Long term (current) use of aspirin: Secondary | ICD-10-CM | POA: Insufficient documentation

## 2018-09-07 DIAGNOSIS — M5116 Intervertebral disc disorders with radiculopathy, lumbar region: Secondary | ICD-10-CM | POA: Diagnosis not present

## 2018-09-07 DIAGNOSIS — M791 Myalgia, unspecified site: Secondary | ICD-10-CM | POA: Diagnosis not present

## 2018-09-07 DIAGNOSIS — Y929 Unspecified place or not applicable: Secondary | ICD-10-CM

## 2018-09-07 DIAGNOSIS — M5126 Other intervertebral disc displacement, lumbar region: Secondary | ICD-10-CM

## 2018-09-07 DIAGNOSIS — I2510 Atherosclerotic heart disease of native coronary artery without angina pectoris: Secondary | ICD-10-CM | POA: Insufficient documentation

## 2018-09-07 DIAGNOSIS — M25552 Pain in left hip: Secondary | ICD-10-CM | POA: Diagnosis not present

## 2018-09-07 DIAGNOSIS — M545 Low back pain: Secondary | ICD-10-CM | POA: Diagnosis not present

## 2018-09-07 LAB — URINALYSIS, ROUTINE W REFLEX MICROSCOPIC
Bilirubin Urine: NEGATIVE
GLUCOSE, UA: NEGATIVE mg/dL
HGB URINE DIPSTICK: NEGATIVE
KETONES UR: NEGATIVE mg/dL
Leukocytes, UA: NEGATIVE
Nitrite: NEGATIVE
PROTEIN: NEGATIVE mg/dL
Specific Gravity, Urine: 1.01 (ref 1.005–1.030)
pH: 6 (ref 5.0–8.0)

## 2018-09-07 LAB — BASIC METABOLIC PANEL
Anion gap: 8 (ref 5–15)
BUN: 23 mg/dL (ref 8–23)
CO2: 24 mmol/L (ref 22–32)
CREATININE: 0.9 mg/dL (ref 0.61–1.24)
Calcium: 9.9 mg/dL (ref 8.9–10.3)
Chloride: 105 mmol/L (ref 98–111)
GFR calc Af Amer: >60 mL/min (ref >60)
GLUCOSE: 83 mg/dL (ref 70–99)
POTASSIUM: 4.1 mmol/L (ref 3.5–5.1)
SODIUM: 137 mmol/L (ref 135–145)

## 2018-09-07 LAB — CBC WITH DIFFERENTIAL/PLATELET
ABS IMMATURE GRANULOCYTES: 0.04 10*3/uL (ref 0.00–0.07)
BASOS PCT: 1 %
Basophils Absolute: 0.1 10*3/uL (ref 0.0–0.1)
EOS PCT: 3 %
Eosinophils Absolute: 0.2 10*3/uL (ref 0.0–0.5)
HCT: 43 % (ref 39.0–52.0)
HEMOGLOBIN: 14.4 g/dL (ref 13.0–17.0)
Immature Granulocytes: 1 %
Lymphocytes Relative: 28 %
Lymphs Abs: 2.5 10*3/uL (ref 0.7–4.0)
MCH: 32.1 pg (ref 26.0–34.0)
MCHC: 33.5 g/dL (ref 30.0–36.0)
MCV: 95.8 fL (ref 80.0–100.0)
MONOS PCT: 9 %
Monocytes Absolute: 0.8 10*3/uL (ref 0.1–1.0)
NEUTROS ABS: 5.2 10*3/uL (ref 1.7–7.7)
Neutrophils Relative %: 58 %
Platelets: 221 10*3/uL (ref 150–400)
RBC: 4.49 MIL/uL (ref 4.22–5.81)
RDW: 11.9 % (ref 11.5–15.5)
WBC: 8.8 10*3/uL (ref 4.0–10.5)
nRBC: 0 % (ref 0.0–0.2)

## 2018-09-07 MED ORDER — GADOBUTROL 1 MMOL/ML IV SOLN
7.0000 mL | Freq: Once | INTRAVENOUS | Status: AC | PRN
  Administered 2018-09-07: 7 mL via INTRAVENOUS

## 2018-09-07 MED ORDER — KETOROLAC TROMETHAMINE 15 MG/ML IJ SOLN
15.0000 mg | Freq: Once | INTRAMUSCULAR | Status: AC
  Administered 2018-09-07: 15 mg via INTRAVENOUS
  Filled 2018-09-07: qty 1

## 2018-09-07 MED ORDER — FENTANYL CITRATE (PF) 100 MCG/2ML IJ SOLN
50.0000 ug | Freq: Once | INTRAMUSCULAR | Status: AC
  Administered 2018-09-07: 50 ug via INTRAVENOUS
  Filled 2018-09-07: qty 2

## 2018-09-07 MED ORDER — LIDOCAINE 5 % EX PTCH: 1.0000 | MEDICATED_PATCH | CUTANEOUS | 0 refills | Status: DC

## 2018-09-07 MED ORDER — LORAZEPAM 2 MG/ML IJ SOLN
0.5000 mg | Freq: Once | INTRAMUSCULAR | Status: AC | PRN
  Administered 2018-09-07: 0.5 mg via INTRAVENOUS
  Filled 2018-09-07: qty 1

## 2018-09-07 MED ORDER — HYDROCODONE-ACETAMINOPHEN 5-325 MG PO TABS
1.0000 | ORAL_TABLET | Freq: Once | ORAL | Status: AC
  Administered 2018-09-07: 1 via ORAL
  Filled 2018-09-07: qty 1

## 2018-09-07 MED ORDER — LIDOCAINE 5 % EX PTCH
1.0000 | MEDICATED_PATCH | CUTANEOUS | Status: DC
  Administered 2018-09-07: 1 via TRANSDERMAL
  Filled 2018-09-07: qty 1

## 2018-09-07 NOTE — ED Provider Notes (Signed)
[facility] EMERGENCY DEPARTMENT Provider Note   CSN: [id number] Arrival date & time: 09/07/18  1211     History   Chief Complaint Chief Complaint  Patient presents with  . Hip Pain    HPI Clayton Braun is a 67 y.o. male with past medical history of CAD, status post lumbar laminectomy/microdiscectomy on 03/22/2018 by Dr. Vertell Limber who presents to ED for 3-week history of left hip pain radiating down his left leg.  Patient began having this pain which was exacerbated worse by a fall last week.  States that he missed a step while going downstairs and fell onto his left knee.  His home pain medications are not able to control the pain.  He reports progressive, worsening sharp shooting L lower back pain pain worse with movement and walking, which radiates to his L groin.  He denies any loss of bowel or bladder function, numbness in arms or legs, dysuria, hematuria, fevers.  HPI  Past Medical History:  Diagnosis Date  . Abdominal pain   . Acute respiratory disease   . Allergic rhinitis   . Anxiety   . AP (abdominal pain)   . Blood in stool   . Body mass index 27.0-27.9, adult   . Bradycardia, sinus   . Bronchitis   . Candidiasis    ORAL  . Chest pain    March, 2013  . Coronary artery disease    Stent to RCA, 2002   /    cath 2009, stent patent, residual 50% LAD  . Depression   . Diverticulosis 2006   COLONOSCOPY DR. Lindalou Hose, H/O POLYP  . Dizziness    occasionaly  . Dyslipidemia   . Fatigue   . Gastroenteritis and colitis, viral   . GERD (gastroesophageal reflux disease)   . Hypercholesterolemia   . Hyperkalemia    Per the patient, 2013  . Hyperlipidemia   . Hypertension   . Hypertriglyceridemia   . Nummular eczema   . Osteoarthritis   . Pharyngitis   . Pityriasis rosea   . Psoriasis   . Rash   . Rhinosinusitis   . Sinusitis   . Skin neoplasm   . Tinea cruris   . Wart     Patient Active Problem List   Diagnosis Date Noted  . Herniated  lumbar disc without myelopathy 03/22/2018  . Chest pain   . Hyperkalemia   . Coronary artery disease   . HYPERTRIGLYCERIDEMIA 06/23/2009  . HYPERLIPIDEMIA-MIXED 06/23/2009  . HYPERTENSION, UNSPECIFIED 06/23/2009  . BRADYCARDIA 06/23/2009    Past Surgical History:  Procedure Laterality Date  . APPENDECTOMY    . CARDIAC CATHETERIZATION     2002  . COLONOSCOPY    . ESOPHAGOGASTRODUODENOSCOPY    . EYE SURGERY     implants, cataracts  . HERNIA REPAIR    . LUMBAR LAMINECTOMY/DECOMPRESSION MICRODISCECTOMY Left 03/22/2018   Procedure: Left Lumbar four-five Microdiscectomy;  Surgeon: Erline Levine, MD;  Location: [facility];  Service: Neurosurgery;  Laterality: Left;  . NASAL SINUS SURGERY    . PENILE PROSTHESIS PLACEMENT          Home Medications    Prior to Admission medications   Medication Sig Start Date End Date Taking? Authorizing Provider  ALPRAZolam Duanne Moron) 1 MG tablet Take 0.5 mg by mouth at bedtime. 02/08/18   [provider]  aspirin 81 MG chewable tablet Chew 81 mg by mouth daily.      [provider]  atenolol (TENORMIN) 25 MG tablet  Take 25 mg by mouth daily.      [provider]  atorvastatin (LIPITOR) 10 MG tablet Take 10 mg by mouth at bedtime.      [provider]  clotrimazole-betamethasone (LOTRISONE) cream Apply 1 application topically 2 (two) times daily.    [provider]  FOLBIC 2.5-25-2 MG TABS Take 1 tablet by mouth Daily. 10/10/11   [provider]  gemfibrozil (LOPID) 600 MG tablet Take 600 mg by mouth 2 (two) times daily.      [provider]  HYDROcodone-acetaminophen (NORCO/VICODIN) 5-325 MG tablet Take 1 tablet by mouth every 6 (six) hours as needed for pain. 03/11/18   [provider]  methocarbamol (ROBAXIN) 500 MG tablet Take 1 tablet (500 mg total) by mouth every 6 (six) hours as needed for muscle spasms. 03/23/18   Kristeen Miss, MD  montelukast (SINGULAIR) 10 MG tablet Take 10 mg by  mouth at bedtime.    [provider]  Multiple Vitamins-Minerals (CENTRUM SILVER PO) Take 1 tablet by mouth daily.    [provider]  nabumetone (RELAFEN) 750 MG tablet Take 750 mg by mouth 2 (two) times daily. 02/28/18   [provider]  oxyCODONE (OXY IR/ROXICODONE) 5 MG immediate release tablet 1-2 tabs every 6 hrs as needed for  pain 03/23/18   Kristeen Miss, MD  pantoprazole (PROTONIX) 40 MG tablet Take 40 mg by mouth daily.    [provider]  PARoxetine (PAXIL) 20 MG tablet Take 20 mg by mouth at bedtime.     [provider]  Polyethyl Glycol-Propyl Glycol (SYSTANE ULTRA OP) Place 1 drop into both eyes daily as needed (dry eyes).    [provider]  ramipril (ALTACE) 2.5 MG capsule Take 2.5 mg by mouth daily.      [provider]  rOPINIRole (REQUIP) 0.25 MG tablet Take 0.5 mg by mouth at bedtime. 03/11/18   [provider]    Family History Family History  Problem Relation Age of Onset  . Lung disease Other   . Heart disease Other   . Heart attack Mother   . COPD Father     Social History Social History   Tobacco Use  . Smoking status: Current Every Day Smoker    Packs/day: 1.00    Years: 48.00    Pack years: 48.00    Types: Cigarettes  . Smokeless tobacco: Never Used  Substance Use Topics  . Alcohol use: Not Currently  . Drug use: No     Allergies   Niacin and related   Review of Systems Review of Systems  Constitutional: Negative for appetite change, chills and fever.  HENT: Negative for ear pain, rhinorrhea, sneezing and sore throat.   Eyes: Negative for photophobia and visual disturbance.  Respiratory: Negative for cough, chest tightness, shortness of breath and wheezing.   Cardiovascular: Negative for chest pain and palpitations.  Gastrointestinal: Negative for abdominal pain, blood in stool, constipation, diarrhea, nausea and vomiting.  Genitourinary: Negative for dysuria, hematuria and  urgency.  Musculoskeletal: Positive for arthralgias, back pain and myalgias.  Skin: Negative for rash.  Neurological: Negative for dizziness, weakness and light-headedness.     Physical Exam Updated Vital Signs BP 110/68   Pulse (!) 53   Temp 98 F (36.7 C) (Oral)   Resp 18   SpO2 96%   Physical Exam Vitals signs and nursing note reviewed.  Constitutional:      General: He is not in acute distress.  Appearance: He is well-developed.  HENT:     Head: Normocephalic and atraumatic.     Nose: Nose normal.  Eyes:     General: No scleral icterus.       Left eye: No discharge.     Conjunctiva/sclera: Conjunctivae normal.  Neck:     Musculoskeletal: Normal range of motion and neck supple.  Cardiovascular:     Rate and Rhythm: Normal rate and regular rhythm.     Heart sounds: Normal heart sounds. No murmur. No friction rub. No gallop.   Pulmonary:     Effort: Pulmonary effort is normal. No respiratory distress.     Breath sounds: Normal breath sounds.  Abdominal:     General: Bowel sounds are normal. There is no distension.     Palpations: Abdomen is soft.     Tenderness: There is no abdominal tenderness. There is no guarding.  Musculoskeletal: Normal range of motion.     Left hip: He exhibits bony tenderness.       Legs:     Comments: No midline spinal tenderness present in lumbar, thoracic or cervical spine. No step-off palpated. No visible bruising, edema or temperature change noted. No objective signs of numbness present. No saddle anesthesia. 2+ DP pulses bilaterally. Sensation intact to light touch. Strength 5/5 in bilateral lower extremities.  Skin:    General: Skin is warm and dry.     Findings: No rash.  Neurological:     Mental Status: He is alert.     Motor: No abnormal muscle tone.     Coordination: Coordination normal.      ED Treatments / Results  Labs (all labs ordered are listed, but only abnormal results are displayed) Labs Reviewed  URINALYSIS,  ROUTINE W REFLEX MICROSCOPIC  BASIC METABOLIC PANEL  CBC WITH DIFFERENTIAL/PLATELET    EKG None  Radiology Dg Hip Unilat W Or Wo Pelvis 2-3 Views Left  Result Date: 09/07/2018 CLINICAL DATA:  Follow approximately 1 week ago. Persistent LEFT hip pain and LEFT groin pain. Initial encounter. EXAM: DG HIP (WITH OR WITHOUT PELVIS) 2-3V LEFT COMPARISON:  None. FINDINGS: No evidence of acute or subacute fracture or dislocation. Joint space well-preserved. Bone mineral density well-preserved. Included AP pelvis demonstrates a symmetric well-preserved joint space in the contralateral RIGHT hip. Sacroiliac joints and symphysis pubis intact without significant degenerative changes. Mild degenerative changes involving the visualized LOWER lumbar spine. Penile prosthesis noted. IMPRESSION: Normal left hip. Electronically Signed   By: Evangeline Dakin M.D.   On: 09/07/2018 14:33    Procedures Procedures (including critical care time)  Medications Ordered in ED Medications  LORazepam (ATIVAN) injection 0.5 mg (has no administration in time range)  fentaNYL (SUBLIMAZE) injection 50 mcg (50 mcg Intravenous Given 09/07/18 1502)     Initial Impression / Assessment and Plan / ED Course  I have reviewed the triage vital signs and the nursing notes.  Pertinent labs & imaging results that were available during my care of the patient were reviewed by me and considered in my medical decision making (see chart for details).     67 year old male who is status post lumbar laminectomy/microdiscectomy on 03/22/2018 by Dr. Vertell Limber presents to ED for 3-week history of left hip pain radiating down left leg.  Pain is exacerbated by a fall last week where he would miss a step while going downstairs and fell onto his left knee.  Her medications are unable to control the pain at this point.  Scheduled for an appointment with  Dr. Vertell Limber next week.  However, he was told that he needed to come to the ED for an MRI with and  without contrast.  He reports pain worse with ambulation and palpation.  However, he has been ambulatory.  No direct injury to the hip.  On exam there is tenderness of the left hip but no midline tenderness.  No numbness, weakness of lower extremities.  No loss of bowel or bladder function.  Urinalysis, CBC, BMP are unremarkable.  Hip x-ray is negative.  Plan is to obtain MRI of the lumbar spine with and without contrast to evaluate for acute pathology.  Pain controlled here in the ED. Care handed off to Dr. Maryan Rued.    Portions of this note were generated with [employer]. Dictation errors may occur despite best attempts at proofreading.   Final Clinical Impressions(s) / ED Diagnoses   Final diagnoses:  None    ED Discharge Orders    None       Delia Heady, PA-C 09/07/18 1554    Blanchie Dessert, MD 09/07/18 1906

## 2018-09-07 NOTE — ED Triage Notes (Signed)
Pt endorses left sided hip pain x 3 weeks ago. Sent by pcp for MRI. Pt had procedure 36months ago on same hip. Having difficulty ambulating. VSS

## 2018-09-07 NOTE — ED Notes (Signed)
Pt returned from mri

## 2018-09-07 NOTE — ED Notes (Signed)
To mri 

## 2018-09-07 NOTE — ED Notes (Signed)
Patient transported to X-ray 

## 2018-09-07 NOTE — ED Notes (Signed)
The  Pt is waiting  For mri  He is having pain

## 2018-09-09 ENCOUNTER — Other Ambulatory Visit: Payer: Self-pay

## 2018-09-09 ENCOUNTER — Other Ambulatory Visit: Payer: Self-pay | Admitting: Neurosurgery

## 2018-09-09 ENCOUNTER — Encounter (HOSPITAL_COMMUNITY): Payer: Self-pay | Admitting: *Deleted

## 2018-09-09 DIAGNOSIS — I10 Essential (primary) hypertension: Secondary | ICD-10-CM | POA: Diagnosis not present

## 2018-09-09 DIAGNOSIS — M545 Low back pain: Secondary | ICD-10-CM | POA: Diagnosis not present

## 2018-09-09 DIAGNOSIS — Z6826 Body mass index (BMI) 26.0-26.9, adult: Secondary | ICD-10-CM | POA: Diagnosis not present

## 2018-09-09 DIAGNOSIS — M5416 Radiculopathy, lumbar region: Secondary | ICD-10-CM | POA: Diagnosis not present

## 2018-09-09 DIAGNOSIS — M5126 Other intervertebral disc displacement, lumbar region: Secondary | ICD-10-CM | POA: Diagnosis not present

## 2018-09-09 NOTE — Progress Notes (Signed)
Mr. Mofield denies chest pain or shortness of breath.  Cardiologist is [name].  Dr Johnsie Cancel cleared patient for back surgery that he had earlier this year.

## 2018-09-10 ENCOUNTER — Encounter (HOSPITAL_COMMUNITY): Payer: Self-pay | Admitting: *Deleted

## 2018-09-10 ENCOUNTER — Encounter (HOSPITAL_COMMUNITY)
Admission: RE | Disposition: A | Discharge status: Home / Self Care | Payer: Self-pay | Source: Home / Self Care | Attending: Neurosurgery

## 2018-09-10 ENCOUNTER — Ambulatory Visit (HOSPITAL_COMMUNITY): Payer: Medicare Other

## 2018-09-10 ENCOUNTER — Ambulatory Visit (HOSPITAL_COMMUNITY): Payer: Medicare Other | Admitting: Certified Registered"

## 2018-09-10 ENCOUNTER — Inpatient Hospital Stay (HOSPITAL_COMMUNITY)
Admission: RE | Admit: 2018-09-10 | Discharge: 2018-09-11 | DRG: 520 | Disposition: A | Discharge status: Home / Self Care | Payer: Medicare Other | Attending: Neurosurgery | Admitting: Neurosurgery

## 2018-09-10 DIAGNOSIS — M4726 Other spondylosis with radiculopathy, lumbar region: Secondary | ICD-10-CM | POA: Diagnosis present

## 2018-09-10 DIAGNOSIS — M5116 Intervertebral disc disorders with radiculopathy, lumbar region: Principal | ICD-10-CM | POA: Diagnosis present

## 2018-09-10 DIAGNOSIS — I10 Essential (primary) hypertension: Secondary | ICD-10-CM | POA: Diagnosis present

## 2018-09-10 DIAGNOSIS — Z981 Arthrodesis status: Secondary | ICD-10-CM | POA: Diagnosis not present

## 2018-09-10 DIAGNOSIS — I2510 Atherosclerotic heart disease of native coronary artery without angina pectoris: Secondary | ICD-10-CM | POA: Diagnosis present

## 2018-09-10 DIAGNOSIS — Z7982 Long term (current) use of aspirin: Secondary | ICD-10-CM | POA: Diagnosis not present

## 2018-09-10 DIAGNOSIS — M5126 Other intervertebral disc displacement, lumbar region: Secondary | ICD-10-CM | POA: Diagnosis present

## 2018-09-10 DIAGNOSIS — Z9889 Other specified postprocedural states: Secondary | ICD-10-CM | POA: Diagnosis not present

## 2018-09-10 DIAGNOSIS — Z79899 Other long term (current) drug therapy: Secondary | ICD-10-CM

## 2018-09-10 DIAGNOSIS — Z419 Encounter for procedure for purposes other than remedying health state, unspecified: Secondary | ICD-10-CM

## 2018-09-10 HISTORY — PX: LUMBAR LAMINECTOMY/DECOMPRESSION MICRODISCECTOMY: SHX5026

## 2018-09-10 HISTORY — DX: Allergy, unspecified, initial encounter: T78.40XA

## 2018-09-10 HISTORY — DX: Pneumonia, unspecified organism: J18.9

## 2018-09-10 SURGERY — LUMBAR LAMINECTOMY/DECOMPRESSION MICRODISCECTOMY 1 LEVEL
Anesthesia: General | Site: Back | Laterality: Left

## 2018-09-10 MED ORDER — POLYVINYL ALCOHOL 1.4 % OP SOLN
Freq: Every day | OPHTHALMIC | Status: DC | PRN
  Filled 2018-09-10: qty 15

## 2018-09-10 MED ORDER — FENTANYL CITRATE (PF) 100 MCG/2ML IJ SOLN
INTRAMUSCULAR | Status: DC | PRN
  Administered 2018-09-10: 50 ug via INTRAVENOUS
  Administered 2018-09-10 (×2): 100 ug via INTRAVENOUS
  Administered 2018-09-10: 50 ug via INTRAVENOUS

## 2018-09-10 MED ORDER — HYDROCODONE-ACETAMINOPHEN 5-325 MG PO TABS: 1.0000 | ORAL_TABLET | Freq: Four times a day (QID) | ORAL | Status: DC | PRN

## 2018-09-10 MED ORDER — ATENOLOL 25 MG PO TABS
12.5000 mg | ORAL_TABLET | Freq: Every day | ORAL | Status: DC
  Administered 2018-09-11: 12.5 mg via ORAL
  Filled 2018-09-10: qty 1

## 2018-09-10 MED ORDER — OXYCODONE HCL 5 MG PO TABS
5.0000 mg | ORAL_TABLET | Freq: Once | ORAL | Status: AC
  Administered 2018-09-10: 5 mg via ORAL

## 2018-09-10 MED ORDER — CHLORHEXIDINE GLUCONATE CLOTH 2 % EX PADS: 6.0000 | MEDICATED_PAD | Freq: Once | CUTANEOUS | Status: DC

## 2018-09-10 MED ORDER — GEMFIBROZIL 600 MG PO TABS
600.0000 mg | ORAL_TABLET | Freq: Two times a day (BID) | ORAL | Status: DC
  Administered 2018-09-10 – 2018-09-11 (×2): 600 mg via ORAL
  Filled 2018-09-10 (×2): qty 1

## 2018-09-10 MED ORDER — CEFAZOLIN SODIUM-DEXTROSE 2-4 GM/100ML-% IV SOLN
2.0000 g | INTRAVENOUS | Status: DC
  Filled 2018-09-10: qty 100

## 2018-09-10 MED ORDER — ALPRAZOLAM 0.5 MG PO TABS: 0.5000 mg | ORAL_TABLET | Freq: Every evening | ORAL | Status: DC | PRN

## 2018-09-10 MED ORDER — OXYCODONE HCL 5 MG PO TABS: 5.0000 mg | ORAL_TABLET | ORAL | Status: DC | PRN

## 2018-09-10 MED ORDER — PHENYLEPHRINE 40 MCG/ML (10ML) SYRINGE FOR IV PUSH (FOR BLOOD PRESSURE SUPPORT)
PREFILLED_SYRINGE | INTRAVENOUS | Status: DC | PRN
  Administered 2018-09-10 (×2): 80 ug via INTRAVENOUS

## 2018-09-10 MED ORDER — MORPHINE SULFATE (PF) 2 MG/ML IV SOLN: 2.0000 mg | INTRAVENOUS | Status: DC | PRN

## 2018-09-10 MED ORDER — PROPOFOL 10 MG/ML IV BOLUS
INTRAVENOUS | Status: DC | PRN
  Administered 2018-09-10: 130 mg via INTRAVENOUS

## 2018-09-10 MED ORDER — KCL IN DEXTROSE-NACL 20-5-0.45 MEQ/L-%-% IV SOLN: INTRAVENOUS | Status: DC

## 2018-09-10 MED ORDER — HYDROMORPHONE HCL 1 MG/ML IJ SOLN: 0.2500 mg | INTRAMUSCULAR | Status: DC | PRN

## 2018-09-10 MED ORDER — MIDAZOLAM HCL 2 MG/2ML IJ SOLN
INTRAMUSCULAR | Status: AC
  Filled 2018-09-10: qty 2

## 2018-09-10 MED ORDER — ACETAMINOPHEN 325 MG PO TABS: 650.0000 mg | ORAL_TABLET | ORAL | Status: DC | PRN

## 2018-09-10 MED ORDER — FLEET ENEMA 7-19 GM/118ML RE ENEM: 1.0000 | ENEMA | Freq: Once | RECTAL | Status: DC | PRN

## 2018-09-10 MED ORDER — FENTANYL CITRATE (PF) 100 MCG/2ML IJ SOLN
INTRAMUSCULAR | Status: AC
  Filled 2018-09-10: qty 2

## 2018-09-10 MED ORDER — BISACODYL 10 MG RE SUPP: 10.0000 mg | Freq: Every day | RECTAL | Status: DC | PRN

## 2018-09-10 MED ORDER — GABAPENTIN 300 MG PO CAPS
300.0000 mg | ORAL_CAPSULE | Freq: Three times a day (TID) | ORAL | Status: DC
  Administered 2018-09-10 – 2018-09-11 (×2): 300 mg via ORAL
  Filled 2018-09-10 (×2): qty 1

## 2018-09-10 MED ORDER — PROPOFOL 10 MG/ML IV BOLUS
INTRAVENOUS | Status: AC
  Filled 2018-09-10: qty 20

## 2018-09-10 MED ORDER — MIDAZOLAM HCL 5 MG/5ML IJ SOLN
INTRAMUSCULAR | Status: DC | PRN
  Administered 2018-09-10: 2 mg via INTRAVENOUS

## 2018-09-10 MED ORDER — METHOCARBAMOL 500 MG PO TABS
500.0000 mg | ORAL_TABLET | Freq: Four times a day (QID) | ORAL | Status: DC | PRN
  Administered 2018-09-10: 500 mg via ORAL

## 2018-09-10 MED ORDER — FA-PYRIDOXINE-CYANOCOBALAMIN 2.5-25-2 MG PO TABS
1.0000 | ORAL_TABLET | Freq: Every day | ORAL | Status: DC
  Administered 2018-09-10 – 2018-09-11 (×2): 1 via ORAL
  Filled 2018-09-10 (×2): qty 1

## 2018-09-10 MED ORDER — CEFAZOLIN SODIUM-DEXTROSE 2-4 GM/100ML-% IV SOLN
2.0000 g | Freq: Three times a day (TID) | INTRAVENOUS | Status: AC
  Administered 2018-09-11 (×2): 2 g via INTRAVENOUS
  Filled 2018-09-10 (×2): qty 100

## 2018-09-10 MED ORDER — ATORVASTATIN CALCIUM 10 MG PO TABS
10.0000 mg | ORAL_TABLET | Freq: Every day | ORAL | Status: DC
  Administered 2018-09-10: 10 mg via ORAL
  Filled 2018-09-10: qty 1

## 2018-09-10 MED ORDER — ROCURONIUM BROMIDE 50 MG/5ML IV SOSY
PREFILLED_SYRINGE | INTRAVENOUS | Status: DC | PRN
  Administered 2018-09-10: 50 mg via INTRAVENOUS

## 2018-09-10 MED ORDER — FENTANYL CITRATE (PF) 250 MCG/5ML IJ SOLN
INTRAMUSCULAR | Status: AC
  Filled 2018-09-10: qty 5

## 2018-09-10 MED ORDER — THROMBIN 5000 UNITS EX SOLR
CUTANEOUS | Status: AC
  Filled 2018-09-10: qty 10000

## 2018-09-10 MED ORDER — LIDOCAINE 2% (20 MG/ML) 5 ML SYRINGE
INTRAMUSCULAR | Status: AC
  Filled 2018-09-10: qty 5

## 2018-09-10 MED ORDER — HYDROCODONE-ACETAMINOPHEN 7.5-325 MG PO TABS: 2.0000 | ORAL_TABLET | ORAL | Status: DC | PRN

## 2018-09-10 MED ORDER — PROMETHAZINE HCL 25 MG/ML IJ SOLN: 6.2500 mg | INTRAMUSCULAR | Status: DC | PRN

## 2018-09-10 MED ORDER — DOCUSATE SODIUM 100 MG PO CAPS
100.0000 mg | ORAL_CAPSULE | Freq: Two times a day (BID) | ORAL | Status: DC
  Administered 2018-09-10 – 2018-09-11 (×2): 100 mg via ORAL
  Filled 2018-09-10 (×2): qty 1

## 2018-09-10 MED ORDER — CEFAZOLIN SODIUM-DEXTROSE 2-3 GM-%(50ML) IV SOLR
INTRAVENOUS | Status: DC | PRN
  Administered 2018-09-10: 2 g via INTRAVENOUS

## 2018-09-10 MED ORDER — NABUMETONE 500 MG PO TABS
750.0000 mg | ORAL_TABLET | Freq: Two times a day (BID) | ORAL | Status: DC
  Administered 2018-09-10 – 2018-09-11 (×2): 750 mg via ORAL
  Filled 2018-09-10 (×2): qty 2

## 2018-09-10 MED ORDER — PANTOPRAZOLE SODIUM 40 MG IV SOLR: 40.0000 mg | Freq: Every day | INTRAVENOUS | Status: DC

## 2018-09-10 MED ORDER — ROCURONIUM BROMIDE 50 MG/5ML IV SOSY
PREFILLED_SYRINGE | INTRAVENOUS | Status: AC
  Filled 2018-09-10: qty 5

## 2018-09-10 MED ORDER — METHYLPREDNISOLONE ACETATE 80 MG/ML IJ SUSP
INTRAMUSCULAR | Status: DC | PRN
  Administered 2018-09-10: 80 mg

## 2018-09-10 MED ORDER — PANTOPRAZOLE SODIUM 40 MG PO TBEC
40.0000 mg | DELAYED_RELEASE_TABLET | Freq: Every day | ORAL | Status: DC
  Administered 2018-09-11: 40 mg via ORAL
  Filled 2018-09-10: qty 1

## 2018-09-10 MED ORDER — ASPIRIN EC 81 MG PO TBEC
81.0000 mg | DELAYED_RELEASE_TABLET | Freq: Every day | ORAL | Status: DC
  Administered 2018-09-11: 81 mg via ORAL
  Filled 2018-09-10: qty 1

## 2018-09-10 MED ORDER — DEXAMETHASONE SODIUM PHOSPHATE 10 MG/ML IJ SOLN
INTRAMUSCULAR | Status: AC
  Filled 2018-09-10: qty 1

## 2018-09-10 MED ORDER — ZOLPIDEM TARTRATE 5 MG PO TABS: 5.0000 mg | ORAL_TABLET | Freq: Every evening | ORAL | Status: DC | PRN

## 2018-09-10 MED ORDER — OXYCODONE HCL 5 MG PO TABS
ORAL_TABLET | ORAL | Status: AC
  Filled 2018-09-10: qty 1

## 2018-09-10 MED ORDER — OXYCODONE HCL 5 MG PO TABS
5.0000 mg | ORAL_TABLET | Freq: Once | ORAL | Status: AC | PRN
  Administered 2018-09-10: 5 mg via ORAL

## 2018-09-10 MED ORDER — SUGAMMADEX SODIUM 200 MG/2ML IV SOLN
INTRAVENOUS | Status: DC | PRN
  Administered 2018-09-10: 200 mg via INTRAVENOUS

## 2018-09-10 MED ORDER — LIDOCAINE-EPINEPHRINE 1 %-1:100000 IJ SOLN
INTRAMUSCULAR | Status: AC
  Filled 2018-09-10: qty 1

## 2018-09-10 MED ORDER — SODIUM CHLORIDE 0.9% FLUSH: 3.0000 mL | Freq: Two times a day (BID) | INTRAVENOUS | Status: DC

## 2018-09-10 MED ORDER — LIDOCAINE 2% (20 MG/ML) 5 ML SYRINGE
INTRAMUSCULAR | Status: DC | PRN
  Administered 2018-09-10: 40 mg via INTRAVENOUS

## 2018-09-10 MED ORDER — SODIUM CHLORIDE 0.9 % IV SOLN
250.0000 mL | INTRAVENOUS | Status: DC
  Administered 2018-09-11: 02:00:00 via INTRAVENOUS

## 2018-09-10 MED ORDER — LIDOCAINE-EPINEPHRINE 1 %-1:100000 IJ SOLN
INTRAMUSCULAR | Status: DC | PRN
  Administered 2018-09-10: 5 mL

## 2018-09-10 MED ORDER — ONDANSETRON HCL 4 MG/2ML IJ SOLN
INTRAMUSCULAR | Status: DC | PRN
  Administered 2018-09-10: 4 mg via INTRAVENOUS

## 2018-09-10 MED ORDER — FENTANYL CITRATE (PF) 100 MCG/2ML IJ SOLN
INTRAMUSCULAR | Status: DC | PRN
  Administered 2018-09-10: 100 ug via INTRAVENOUS

## 2018-09-10 MED ORDER — PHENOL 1.4 % MT LIQD: 1.0000 | OROMUCOSAL | Status: DC | PRN

## 2018-09-10 MED ORDER — THROMBIN 5000 UNITS EX SOLR
OROMUCOSAL | Status: DC | PRN
  Administered 2018-09-10: 18:00:00 via TOPICAL

## 2018-09-10 MED ORDER — METHOCARBAMOL 1000 MG/10ML IJ SOLN
500.0000 mg | Freq: Four times a day (QID) | INTRAVENOUS | Status: DC | PRN
  Filled 2018-09-10: qty 5

## 2018-09-10 MED ORDER — ONDANSETRON HCL 4 MG PO TABS: 4.0000 mg | ORAL_TABLET | Freq: Four times a day (QID) | ORAL | Status: DC | PRN

## 2018-09-10 MED ORDER — ROPINIROLE HCL 0.25 MG PO TABS
0.2500 mg | ORAL_TABLET | Freq: Every evening | ORAL | Status: DC | PRN
  Filled 2018-09-10: qty 2

## 2018-09-10 MED ORDER — ONDANSETRON HCL 4 MG/2ML IJ SOLN: 4.0000 mg | Freq: Four times a day (QID) | INTRAMUSCULAR | Status: DC | PRN

## 2018-09-10 MED ORDER — LACTATED RINGERS IV SOLN
INTRAVENOUS | Status: DC | PRN
  Administered 2018-09-10 (×2): via INTRAVENOUS

## 2018-09-10 MED ORDER — BUPIVACAINE HCL (PF) 0.5 % IJ SOLN
INTRAMUSCULAR | Status: DC | PRN
  Administered 2018-09-10: 5 mL

## 2018-09-10 MED ORDER — CELECOXIB 200 MG PO CAPS
200.0000 mg | ORAL_CAPSULE | Freq: Two times a day (BID) | ORAL | Status: DC
  Administered 2018-09-10 – 2018-09-11 (×2): 200 mg via ORAL
  Filled 2018-09-10 (×2): qty 1

## 2018-09-10 MED ORDER — OXYCODONE HCL 5 MG/5ML PO SOLN: 5.0000 mg | Freq: Once | ORAL | Status: AC | PRN

## 2018-09-10 MED ORDER — DEXAMETHASONE SODIUM PHOSPHATE 4 MG/ML IJ SOLN
INTRAMUSCULAR | Status: DC | PRN
  Administered 2018-09-10: 10 mg via INTRAVENOUS

## 2018-09-10 MED ORDER — RAMIPRIL 2.5 MG PO CAPS
2.5000 mg | ORAL_CAPSULE | Freq: Every day | ORAL | Status: DC
  Administered 2018-09-11: 2.5 mg via ORAL
  Filled 2018-09-10: qty 1

## 2018-09-10 MED ORDER — ONDANSETRON HCL 4 MG/2ML IJ SOLN
INTRAMUSCULAR | Status: AC
  Filled 2018-09-10: qty 2

## 2018-09-10 MED ORDER — PHENYLEPHRINE 40 MCG/ML (10ML) SYRINGE FOR IV PUSH (FOR BLOOD PRESSURE SUPPORT)
PREFILLED_SYRINGE | INTRAVENOUS | Status: AC
  Filled 2018-09-10: qty 10

## 2018-09-10 MED ORDER — OXYCODONE HCL 5 MG PO TABS
5.0000 mg | ORAL_TABLET | Freq: Four times a day (QID) | ORAL | Status: DC | PRN
  Administered 2018-09-11 (×2): 10 mg via ORAL
  Filled 2018-09-10 (×2): qty 2

## 2018-09-10 MED ORDER — METHOCARBAMOL 500 MG PO TABS: 500.0000 mg | ORAL_TABLET | Freq: Four times a day (QID) | ORAL | Status: DC | PRN

## 2018-09-10 MED ORDER — ALUM & MAG HYDROXIDE-SIMETH 200-200-20 MG/5ML PO SUSP: 30.0000 mL | Freq: Four times a day (QID) | ORAL | Status: DC | PRN

## 2018-09-10 MED ORDER — ADULT MULTIVITAMIN W/MINERALS CH
1.0000 | ORAL_TABLET | Freq: Every day | ORAL | Status: DC
  Administered 2018-09-11: 1 via ORAL
  Filled 2018-09-10: qty 1

## 2018-09-10 MED ORDER — BUPIVACAINE HCL (PF) 0.5 % IJ SOLN
INTRAMUSCULAR | Status: AC
  Filled 2018-09-10: qty 30

## 2018-09-10 MED ORDER — 0.9 % SODIUM CHLORIDE (POUR BTL) OPTIME
TOPICAL | Status: DC | PRN
  Administered 2018-09-10: 1000 mL

## 2018-09-10 MED ORDER — ACETAMINOPHEN 650 MG RE SUPP: 650.0000 mg | RECTAL | Status: DC | PRN

## 2018-09-10 MED ORDER — MONTELUKAST SODIUM 10 MG PO TABS
10.0000 mg | ORAL_TABLET | Freq: Every day | ORAL | Status: DC
  Administered 2018-09-10: 10 mg via ORAL
  Filled 2018-09-10: qty 1

## 2018-09-10 MED ORDER — MENTHOL 3 MG MT LOZG
1.0000 | LOZENGE | OROMUCOSAL | Status: DC | PRN
  Administered 2018-09-11: 3 mg via ORAL
  Filled 2018-09-10: qty 9

## 2018-09-10 MED ORDER — SODIUM CHLORIDE 0.9% FLUSH: 3.0000 mL | INTRAVENOUS | Status: DC | PRN

## 2018-09-10 MED ORDER — GLYCOPYRROLATE PF 0.2 MG/ML IJ SOSY
PREFILLED_SYRINGE | INTRAMUSCULAR | Status: DC | PRN
  Administered 2018-09-10: .1 mg via INTRAVENOUS

## 2018-09-10 MED ORDER — MEPERIDINE HCL 50 MG/ML IJ SOLN: 6.2500 mg | INTRAMUSCULAR | Status: DC | PRN

## 2018-09-10 MED ORDER — LACTATED RINGERS IV SOLN
INTRAVENOUS | Status: DC
  Administered 2018-09-10: 16:00:00 via INTRAVENOUS

## 2018-09-10 MED ORDER — GLYCOPYRROLATE PF 0.2 MG/ML IJ SOSY
PREFILLED_SYRINGE | INTRAMUSCULAR | Status: AC
  Filled 2018-09-10: qty 1

## 2018-09-10 MED ORDER — METHOCARBAMOL 500 MG PO TABS
ORAL_TABLET | ORAL | Status: AC
  Filled 2018-09-10: qty 1

## 2018-09-10 MED ORDER — METHYLPREDNISOLONE ACETATE 80 MG/ML IJ SUSP
INTRAMUSCULAR | Status: AC
  Filled 2018-09-10: qty 1

## 2018-09-10 MED ORDER — PAROXETINE HCL 20 MG PO TABS
20.0000 mg | ORAL_TABLET | Freq: Every day | ORAL | Status: DC
  Administered 2018-09-10: 20 mg via ORAL
  Filled 2018-09-10: qty 1

## 2018-09-10 MED ORDER — POLYETHYLENE GLYCOL 3350 17 G PO PACK: 17.0000 g | PACK | Freq: Every day | ORAL | Status: DC | PRN

## 2018-09-10 SURGICAL SUPPLY — 52 items
BLADE CLIPPER SURG (BLADE) ×3 IMPLANT
BUR MATCHSTICK NEURO 3.0 LAGG (BURR) ×3 IMPLANT
BUR ROUND FLUTED 5 RND (BURR) ×2 IMPLANT
BUR ROUND FLUTED 5MM RND (BURR) ×1
CANISTER SUCT 3000ML PPV (MISCELLANEOUS) ×3 IMPLANT
CARTRIDGE OIL MAESTRO DRILL (MISCELLANEOUS) ×1 IMPLANT
COVER WAND RF STERILE (DRAPES) IMPLANT
DECANTER SPIKE VIAL GLASS SM (MISCELLANEOUS) ×3 IMPLANT
DERMABOND ADVANCED (GAUZE/BANDAGES/DRESSINGS) ×2
DERMABOND ADVANCED .7 DNX12 (GAUZE/BANDAGES/DRESSINGS) ×1 IMPLANT
DIFFUSER DRILL AIR PNEUMATIC (MISCELLANEOUS) ×3 IMPLANT
DRAPE LAPAROTOMY 100X72X124 (DRAPES) ×3 IMPLANT
DRAPE MICROSCOPE LEICA (MISCELLANEOUS) ×3 IMPLANT
DRAPE SURG 17X23 STRL (DRAPES) ×3 IMPLANT
DRSG OPSITE POSTOP 3X4 (GAUZE/BANDAGES/DRESSINGS) ×3 IMPLANT
DURAPREP 26ML APPLICATOR (WOUND CARE) ×3 IMPLANT
ELECT REM PT RETURN 9FT ADLT (ELECTROSURGICAL) ×3
ELECTRODE REM PT RTRN 9FT ADLT (ELECTROSURGICAL) ×1 IMPLANT
GAUZE 4X4 16PLY RFD (DISPOSABLE) IMPLANT
GAUZE SPONGE 4X4 12PLY STRL (GAUZE/BANDAGES/DRESSINGS) IMPLANT
GLOVE BIO SURGEON STRL SZ8 (GLOVE) ×3 IMPLANT
GLOVE BIOGEL PI IND STRL 8 (GLOVE) ×1 IMPLANT
GLOVE BIOGEL PI IND STRL 8.5 (GLOVE) ×1 IMPLANT
GLOVE BIOGEL PI INDICATOR 8 (GLOVE) ×2
GLOVE BIOGEL PI INDICATOR 8.5 (GLOVE) ×2
GLOVE ECLIPSE 8.0 STRL XLNG CF (GLOVE) ×3 IMPLANT
GLOVE EXAM NITRILE XL STR (GLOVE) IMPLANT
GOWN STRL REUS W/ TWL LRG LVL3 (GOWN DISPOSABLE) ×3 IMPLANT
GOWN STRL REUS W/ TWL XL LVL3 (GOWN DISPOSABLE) ×1 IMPLANT
GOWN STRL REUS W/TWL 2XL LVL3 (GOWN DISPOSABLE) ×3 IMPLANT
GOWN STRL REUS W/TWL LRG LVL3 (GOWN DISPOSABLE) ×6
GOWN STRL REUS W/TWL XL LVL3 (GOWN DISPOSABLE) ×2
HEMOSTAT POWDER KIT SURGIFOAM (HEMOSTASIS) ×3 IMPLANT
KIT BASIN OR (CUSTOM PROCEDURE TRAY) ×3 IMPLANT
KIT TURNOVER KIT B (KITS) ×3 IMPLANT
NEEDLE HYPO 18GX1.5 BLUNT FILL (NEEDLE) ×3 IMPLANT
NEEDLE HYPO 25X1 1.5 SAFETY (NEEDLE) ×3 IMPLANT
NEEDLE SPNL 18GX3.5 QUINCKE PK (NEEDLE) ×3 IMPLANT
NS IRRIG 1000ML POUR BTL (IV SOLUTION) ×3 IMPLANT
OIL CARTRIDGE MAESTRO DRILL (MISCELLANEOUS) ×3
PACK LAMINECTOMY NEURO (CUSTOM PROCEDURE TRAY) ×3 IMPLANT
PAD ARMBOARD 7.5X6 YLW CONV (MISCELLANEOUS) ×9 IMPLANT
RUBBERBAND STERILE (MISCELLANEOUS) ×6 IMPLANT
SPONGE SURGIFOAM ABS GEL SZ50 (HEMOSTASIS) IMPLANT
SUT VIC AB 0 CT1 18XCR BRD8 (SUTURE) ×1 IMPLANT
SUT VIC AB 0 CT1 8-18 (SUTURE) ×2
SUT VIC AB 2-0 CT1 18 (SUTURE) ×3 IMPLANT
SUT VIC AB 3-0 SH 8-18 (SUTURE) ×3 IMPLANT
SYR 5ML LL (SYRINGE) ×3 IMPLANT
TOWEL GREEN STERILE (TOWEL DISPOSABLE) ×3 IMPLANT
TOWEL GREEN STERILE FF (TOWEL DISPOSABLE) ×3 IMPLANT
WATER STERILE IRR 1000ML POUR (IV SOLUTION) ×3 IMPLANT

## 2018-09-10 NOTE — H&P (Signed)
Patient ID:   [id number] Patient: Clayton Braun  Date of Birth: 1951/08/12 Visit Type: Office Visit   Date: 09/09/2018 11:45 AM Provider: Marchia Meiers. Vertell Limber MD   This 67 year old male presents for back pain.  HISTORY OF PRESENT ILLNESS: 1.  back pain  03/22/18 left L4-5 microdiscectomy  Patient returns for review of MRI obtained over the weekend in the ED.    Medrol Dosepak offered no relief and MRI was scheduled.  Pain increased low back and left leg prompting an ER visit.  Expedited MRI was obtained.  Norco 5/325 q.i.d. Offers no relief Robaxin 750 mg offers no relief  MRI on [facility]      Medical/Surgical/Interim History Reviewed, no change.  Last detailed document date:03/11/2018.     Family History: Reviewed, no changes.  Last detailed document date:03/11/2018.   Social History: Reviewed, no changes. Last detailed document date: 03/11/2018.    MEDICATIONS: (added, continued or stopped this visit) Started Medication Directions Instruction Stopped  Aspir-81 81 mg tablet,delayed release take 1 tablet by oral route  every day    atenolol 25 mg tablet take 1 tablet by oral route  every bedtime    atorvastatin 10 mg tablet take 1 tablet by oral route  every day    desloratadine 5 mg tablet take 1 tablet by oral route  every day    esomeprazole magnesium 40 mg capsule,delayed release take 1 capsule by oral route  every day    gemfibrozil 600 mg tablet take 1 tablet by oral route 2 times every day   03/11/2018 hydrocodone 5 mg-acetaminophen 325 mg tablet take 1 tablet by oral route  every 6 hours as needed for pain as needed   05/15/2018 hydrocodone 5 mg-acetaminophen 325 mg tablet take 1 tablet by oral route  every 6 hours as needed for pain   08/26/2018 Medrol (Pak) 4 mg tablets in a dose pack take by Oral route as directed   03/11/2018 methocarbamol 750 mg tablet take 1 tablet by oral route 3 times every day as  needed    nabumetone 750 mg tablet take 1 tablet by oral route 2 times every day    paroxetine 20 mg tablet take 1 tablet by oral route  every morning   09/09/2018 Percocet 10 mg-325 mg tablet take 1 tablet by oral route  every 6 hours as needed    ramipril 2.5 mg capsule take 1 capsule by oral route  every day   03/11/2018 ropinirole 0.25 mg tablet take 1 tablet by mouth at bedtime x 2days, then 2 tabs at bedtime x5days, then 4 tabs (1mg ) at bedtime nightly    roprinirole 0.25 daily days 1 -2, then 0.5 mg days 3 - 7, then 1 mg daily for restless legs      ALLERGIES: Ingredient Reaction Medication Name Comment NIACIN  NIACIN  INOSITOL NIACINATE  NIACIN  NIACINAMIDE  NIACIN      PHYSICAL EXAM:  Vitals Date Temp F BP Pulse Ht In Wt Lb BMI BSA Pain Score 09/09/2018  133/84 67 66 162 26.15  10/10     IMPRESSION:  L-spine MRI with and without contrast reveals L4-5 recurrent left worse than right disc herniation with nerve impingement. Recommended surgical intervention - left L4-5 microdiscectomy recommended. Patient was tearful and unable to sit in a chair normally due to his pain. Left L4-5 microdiscectomy is scheduled for 09/10/18. Discussed current medications - patient's current medications are not providing any significant relief from his pain - Rx  10/325 percocet - discontinue Norco. Patient will return after surgery.  PLAN: 1. Nurse education given 2. left L4-5 microdiscectomy scheduled 09/10/18 3. Rx 10/325 percocet 4. Follow-up after surgery  Orders: Instruction(s)/Education: Assessment Instruction  Tobacco cessation counseling I10 Hypertension education Z68.26 Lifestyle education regarding diet  Completed Orders (this encounter) Order Details Reason Side Interpretation Result Initial Treatment Date Region Tobacco cessation counseling        Hypertension education Patient to follow up with  primary care provider       Lifestyle education regarding diet Patient encouraged to eat a well balance diet        Assessment/Plan  # Detail Type Description  1. Assessment Essential (primary) hypertension (I10).     2. Assessment Body mass index (BMI) 26.0-26.9, adult (E93.81).  Plan Orders Today's instructions / counseling include(s) Lifestyle education regarding diet. Clinical information/comments: Patient encouraged to eat a well balance diet.       Pain Management Plan Pain Scale: 10/10. Method: Numeric Pain Intensity Scale. Location: back. Onset: 01/29/2018. Duration: varies. Quality: discomforting. Pain management follow-up plan of care: Patient taking medication as prescribed.  Fall Risk Plan The patient has not fallen in the last year.     MEDICATIONS PRESCRIBED TODAY    Rx Quantity Refills PERCOCET 10 mg-325 mg  60 0           Provider:  Marchia Meiers. Vertell Limber MD  09/09/2018 01:10 PM Dictation edited by: Mirian Mo    CC Providers: [facility] [address] [place],  [place]  [id number]-   James Dailey  [address] [place] [address]-               Electronically signed by Marchia Meiers. Vertell Limber MD on 09/09/2018 01:50 PM

## 2018-09-10 NOTE — Anesthesia Preprocedure Evaluation (Addendum)
Anesthesia Evaluation  Patient identified by MRN, date of birth, ID band Patient awake    Reviewed: Allergy & Precautions, NPO status , Patient's Chart, lab work & pertinent test results, reviewed documented beta blocker date and time   Airway Mallampati: II  TM Distance: >3 FB Neck ROM: Full    Dental no notable dental hx. (+) Dental Advisory Given, Teeth Intact   Pulmonary Current Smoker,    Pulmonary exam normal breath sounds clear to auscultation       Cardiovascular hypertension, Pt. on home beta blockers and Pt. on medications + CAD  Normal cardiovascular exam Rhythm:Regular Rate:Normal     Neuro/Psych PSYCHIATRIC DISORDERS Anxiety Depression negative neurological ROS     GI/Hepatic Neg liver ROS, GERD  ,  Endo/Other  negative endocrine ROS  Renal/GU negative Renal ROS     Musculoskeletal  (+) Arthritis ,   Abdominal   Peds  Hematology negative hematology ROS (+)   Anesthesia Other Findings   Reproductive/Obstetrics negative OB ROS                            Anesthesia Physical  Anesthesia Plan  ASA: III  Anesthesia Plan: General   Post-op Pain Management:    Induction: Intravenous  PONV Risk Score and Plan: 2 and Ondansetron, Dexamethasone and Treatment may vary due to age or medical condition  Airway Management Planned: Oral ETT  Additional Equipment: None  Intra-op Plan:   Post-operative Plan: Extubation in OR  Informed Consent: I have reviewed the patients History and Physical, chart, labs and discussed the procedure including the risks, benefits and alternatives for the proposed anesthesia with the patient or authorized representative who has indicated his/her understanding and acceptance.   Dental advisory given  Plan Discussed with: CRNA  Anesthesia Plan Comments:         Anesthesia Quick Evaluation

## 2018-09-10 NOTE — Anesthesia Procedure Notes (Signed)
Procedure Name: Intubation Date/Time: 09/10/2018 5:03 PM Performed by: Orlie Dakin, CRNA Pre-anesthesia Checklist: Patient identified, Suction available, Patient being monitored and Emergency Drugs available Patient Re-evaluated:Patient Re-evaluated prior to induction Oxygen Delivery Method: Circle system utilized Preoxygenation: Pre-oxygenation with 100% oxygen Induction Type: IV induction Ventilation: Mask ventilation without difficulty Laryngoscope Size: Miller and 3 Grade View: Grade I Tube type: Oral Tube size: 7.5 mm Number of attempts: 1 Airway Equipment and Method: Stylet Placement Confirmation: ETT inserted through vocal cords under direct vision,  positive ETCO2 and breath sounds checked- equal and bilateral Secured at: 22 cm Tube secured with: Tape Dental Injury: Teeth and Oropharynx as per pre-operative assessment

## 2018-09-10 NOTE — Anesthesia Postprocedure Evaluation (Signed)
Anesthesia Post Note  Patient: Clayton Braun  Procedure(s) Performed: Redo Left Lumbar four-five Microdiscectomy (Left Back)     Patient location during evaluation: PACU Anesthesia Type: General Level of consciousness: awake and alert Pain management: pain level controlled Vital Signs Assessment: post-procedure vital signs reviewed and stable Respiratory status: spontaneous breathing, nonlabored ventilation and respiratory function stable Cardiovascular status: blood pressure returned to baseline and stable Postop Assessment: no apparent nausea or vomiting Anesthetic complications: no    Last Vitals:  Vitals:   09/10/18 1930 09/10/18 1957  BP:  125/66  Pulse: (!) 59 (!) 56  Resp: 16 18  Temp:  36.4 C  SpO2: 96% 96%    Last Pain:  Vitals:   09/10/18 1957  TempSrc: Oral  PainSc:                  Wendi Lastra,W. EDMOND

## 2018-09-10 NOTE — Progress Notes (Signed)
Sleepy, but arousable.  MAEW with good strength.  Left leg pain better.  Doing well.

## 2018-09-10 NOTE — Op Note (Signed)
09/10/2018  6:07 PM  PATIENT:  Clayton Braun  67 y.o. male  PRE-OPERATIVE DIAGNOSIS:  Recurrent lumbar disc herniation L 45 Left with spondylosis, disc degeneration, radiculopathy  POST-OPERATIVE DIAGNOSIS:  Recurrent lumbar disc herniation L 45 Left with spondylosis, disc degeneration, radiculopathy  PROCEDURE:  Procedure(s) with comments: Redo Left Lumbar 4-5 Microdiscectomy (Left) - Redo Left Lumbar 4-5 Microdiscectomy  SURGEON:  Surgeon(s) and Role:    Erline Levine, MD - Primary    * Judith Part, MD - Assisting  PHYSICIAN ASSISTANT:   ASSISTANTS: Poteat, RN   ANESTHESIA:   general  EBL:  Minimal  BLOOD ADMINISTERED:none  DRAINS: none   LOCAL MEDICATIONS USED:  MARCAINE    and LIDOCAINE   SPECIMEN:  No Specimen  DISPOSITION OF SPECIMEN:  N/A  COUNTS:  YES  TOURNIQUET:  * No tourniquets in log *  DICTATION: DICTATION: Patient has a recurrent L4-5 disc rupture on the left with significant left leg weakness. It was elected to take him to surgery for redo left L4-5 microdiscectomy.  He had prior left L4/5 discectomy in the recent past.  Procedure: Patient was brought to the operating room and following the smooth and uncomplicated induction of general endotracheal anesthesia he was placed in a prone position on the Wilson frame. Low back was prepped and draped in the usual sterile fashion with DuraPrep. Area of planned incision was infiltrated with local lidocaine. Incision was made in the midline through previous scar tissue and carried to the lumbodorsal fascia which was incised on the left side of midline. Subperiosteal dissection was performed exposing what was felt to be L45 level. Intraoperative x-ray confirmed correct level.   Exposure was carried to expose the L4-5 level and site of prior laminectomy. The previous bony defect was defined and bone removal was extended in each direction with the high speed drill and completed with Kerrison rongeurs and a  generous foraminotomy was performed overlying the superior aspect of the L5 lamina. Scar tissue was detached and removed in a piecemeal fashion and under the microscope. The L5 nerve root was mobilized medially exposing multiple fragments of caudally migrated disc material which were causing significant L5 nerve root compression.  I reopened the interspace and removed residual loose disc material.  The wound was then irrigated with bacitracin saline and no additional disc material was mobilized. Hemostasis was assured with bipolar electrocautery and the interspace was irrigated with Depo-Medrol and fentanyl. The lumbodorsal fascia was closed with 0 Vicryl sutures the subcutaneous tissues reapproximated 2-0 Vicryl inverted sutures and the skin edges were reapproximated with 3-0 Vicryl subcuticular stitch. The wound is dressed with Dermabond. Patient was extubated in the operating room and taken to recovery in stable and satisfactory condition having tolerated his operation well. Counts were correct at the end of the case.  PLAN OF CARE: Admit to inpatient   PATIENT DISPOSITION:  PACU - hemodynamically stable.   Delay start of Pharmacological VTE agent (>24hrs) due to surgical blood loss or risk of bleeding: yes

## 2018-09-10 NOTE — Transfer of Care (Signed)
Immediate Anesthesia Transfer of Care Note  Patient: Clayton Braun  Procedure(s) Performed: Redo Left Lumbar four-five Microdiscectomy (Left Back)  Patient Location: PACU  Anesthesia Type:General  Level of Consciousness: awake and patient cooperative  Airway & Oxygen Therapy: Patient Spontanous Breathing and Patient connected to nasal cannula oxygen  Post-op Assessment: Report given to RN, Post -op Vital signs reviewed and stable and Patient moving all extremities X 4  Post vital signs: Reviewed and stable  Last Vitals:  Vitals Value Taken Time  BP 128/69 09/10/2018  6:25 PM  Temp    Pulse 73 09/10/2018  6:25 PM  Resp 16 09/10/2018  6:25 PM  SpO2 100 % 09/10/2018  6:25 PM  Vitals shown include unvalidated device data.  Last Pain:  Vitals:   09/10/18 1512  TempSrc:   PainSc: 8       Patients Stated Pain Goal: 4 (35/67/01 4103)  Complications: No apparent anesthesia complications

## 2018-09-10 NOTE — Interval H&P Note (Signed)
History and Physical Interval Note:  09/10/2018 4:50 PM  Clayton Braun  has presented today for surgery, with the diagnosis of Recurrent lumbar disc herniation  The various methods of treatment have been discussed with the patient and family. After consideration of risks, benefits and other options for treatment, the patient has consented to  Procedure(s) with comments: Redo Left Lumbar 4-5 Microdiscectomy (Left) - Redo Left Lumbar 4-5 Microdiscectomy as a surgical intervention .  The patient's history has been reviewed, patient examined, no change in status, stable for surgery.  I have reviewed the patient's chart and labs.  Questions were answered to the patient's satisfaction.     Peggyann Shoals

## 2018-09-10 NOTE — Brief Op Note (Signed)
09/10/2018  6:07 PM  PATIENT:  Clayton Braun  67 y.o. male  PRE-OPERATIVE DIAGNOSIS:  Recurrent lumbar disc herniation L 45 Left with spondylosis, disc degeneration, radiculopathy  POST-OPERATIVE DIAGNOSIS:  Recurrent lumbar disc herniation L 45 Left with spondylosis, disc degeneration, radiculopathy  PROCEDURE:  Procedure(s) with comments: Redo Left Lumbar 4-5 Microdiscectomy (Left) - Redo Left Lumbar 4-5 Microdiscectomy  SURGEON:  Surgeon(s) and Role:    Erline Levine, MD - Primary    * Judith Part, MD - Assisting  PHYSICIAN ASSISTANT:   ASSISTANTS: Poteat, RN   ANESTHESIA:   general  EBL:  Minimal  BLOOD ADMINISTERED:none  DRAINS: none   LOCAL MEDICATIONS USED:  MARCAINE    and LIDOCAINE   SPECIMEN:  No Specimen  DISPOSITION OF SPECIMEN:  N/A  COUNTS:  YES  TOURNIQUET:  * No tourniquets in log *  DICTATION: DICTATION: Patient has a recurrent L4-5 disc rupture on the left with significant left leg weakness. It was elected to take him to surgery for redo left L4-5 microdiscectomy.  He had prior left L4/5 discectomy in the recent past.  Procedure: Patient was brought to the operating room and following the smooth and uncomplicated induction of general endotracheal anesthesia he was placed in a prone position on the Wilson frame. Low back was prepped and draped in the usual sterile fashion with DuraPrep. Area of planned incision was infiltrated with local lidocaine. Incision was made in the midline through previous scar tissue and carried to the lumbodorsal fascia which was incised on the left side of midline. Subperiosteal dissection was performed exposing what was felt to be L45 level. Intraoperative x-ray confirmed correct level.   Exposure was carried to expose the L4-5 level and site of prior laminectomy. The previous bony defect was defined and bone removal was extended in each direction with the high speed drill and completed with Kerrison rongeurs and a  generous foraminotomy was performed overlying the superior aspect of the L5 lamina. Scar tissue was detached and removed in a piecemeal fashion and under the microscope. The L5 nerve root was mobilized medially exposing multiple fragments of caudally migrated disc material which were causing significant L5 nerve root compression.  I reopened the interspace and removed residual loose disc material.  The wound was then irrigated with bacitracin saline and no additional disc material was mobilized. Hemostasis was assured with bipolar electrocautery and the interspace was irrigated with Depo-Medrol and fentanyl. The lumbodorsal fascia was closed with 0 Vicryl sutures the subcutaneous tissues reapproximated 2-0 Vicryl inverted sutures and the skin edges were reapproximated with 3-0 Vicryl subcuticular stitch. The wound is dressed with Dermabond. Patient was extubated in the operating room and taken to recovery in stable and satisfactory condition having tolerated his operation well. Counts were correct at the end of the case.  PLAN OF CARE: Admit to inpatient   PATIENT DISPOSITION:  PACU - hemodynamically stable.   Delay start of Pharmacological VTE agent (>24hrs) due to surgical blood loss or risk of bleeding: yes

## 2018-09-11 ENCOUNTER — Encounter (HOSPITAL_COMMUNITY): Payer: Self-pay | Admitting: Neurosurgery

## 2018-09-11 MED ORDER — OXYCODONE HCL 5 MG PO TABS: 5.0000 mg | ORAL_TABLET | ORAL | 0 refills | Status: DC | PRN

## 2018-09-11 MED ORDER — GABAPENTIN 300 MG PO CAPS: 300.0000 mg | ORAL_CAPSULE | Freq: Three times a day (TID) | ORAL | 0 refills | Status: DC

## 2018-09-11 MED ORDER — DOCUSATE SODIUM 100 MG PO CAPS: 100.0000 mg | ORAL_CAPSULE | Freq: Two times a day (BID) | ORAL | 0 refills | Status: DC

## 2018-09-11 NOTE — Plan of Care (Signed)

## 2018-09-11 NOTE — Social Work (Signed)
CSW acknowledging consult for SNF placement. Current therapy recommendations state no follow up needed.  Pt set to discharge home.   CSW signing off. Please consult if any additional needs arise.  Alexander Mt, [place] Work [phone]

## 2018-09-11 NOTE — Evaluation (Signed)
Occupational Therapy Evaluation Patient Details Name: Clayton Braun MRN: [id number] DOB: [birth year] Today's Date: 09/11/2018    History of Present Illness  68 yo male with recurrent disc hernation at L4-5 with L LE radiating pain. 12/31 pt s/p re-do L lumbar 4-5 microdiscectomy. PSH: 03/22/18 L L4-5 microdiscetomy.   Clinical Impression   Patient evaluated by Occupational Therapy with no further acute OT needs identified. All education has been completed and the patient has no further questions. See below for any follow-up Occupational Therapy or equipment needs. OT to sign off. Thank you for referral.      Follow Up Recommendations  No OT follow up    Equipment Recommendations  None recommended by OT    Recommendations for Other Services       Precautions / Restrictions Precautions Precautions: Back Precaution Booklet Issued: Yes (comment) Precaution Comments: pt with good recall of back precautions and reviewed for adls Restrictions Weight Bearing Restrictions: No      Mobility Bed Mobility Overal bed mobility: Needs Assistance Bed Mobility: Rolling;Sit to Supine Rolling: Supervision Sidelying to sit: Supervision       General bed mobility comments: cues for precaution but able to sequence  Transfers Overall transfer level: Needs assistance Equipment used: None Transfers: Sit to/from Stand Sit to Stand: Supervision         General transfer comment: cues to scoot to the edge of the chair to help with pain management and decr pushing with arms    Balance Overall balance assessment: No apparent balance deficits (not formally assessed)(pt took self to bathroom and stood to urinate w/o difficulty)                                         ADL either performed or assessed with clinical judgement   ADL Overall ADL's : Modified independent                                       General ADL Comments: pt is able to cross in figure  4 with L more painful. Pt educated on back precautions and return demo.  demo shower transfer and able to occlude vision safely      Vision Baseline Vision/History: No visual deficits       Perception     Praxis      Pertinent Vitals/Pain Pain Assessment: No/denies pain Pain Score: 2  Pain Location: incision Pain Descriptors / Indicators: Sore Pain Intervention(s): Monitored during session     Hand Dominance Right   Extremity/Trunk Assessment Upper Extremity Assessment Upper Extremity Assessment: Overall WFL for tasks assessed   Lower Extremity Assessment Lower Extremity Assessment: Overall WFL for tasks assessed   Cervical / Trunk Assessment Cervical / Trunk Assessment: Other exceptions Cervical / Trunk Exceptions: recent back surgery   Communication Communication Communication: No difficulties   Cognition Arousal/Alertness: Awake/alert Behavior During Therapy: WFL for tasks assessed/performed Overall Cognitive Status: Within Functional Limits for tasks assessed                                     General Comments  incision dry and intact    Exercises     Shoulder Instructions      Home Living Family/patient expects  to be discharged to:: Private residence Living Arrangements: Spouse/significant other Available Help at Discharge: Family;Available 24 hours/day Type of Home: House Home Access: Stairs to enter [employer] of Steps: 1 Entrance Stairs-Rails: None Home Layout: Two level;Able to live on main level with bedroom/bathroom     Bathroom Shower/Tub: Walk-in shower;Door   [employer] Toilet: Standard     Home Equipment: Building services engineer Comments: wife there to (A) and son in Sports coach      Prior Functioning/Environment Level of Independence: Independent                 OT Problem List:        OT Treatment/Interventions:      OT Goals(Current goals can be found in the care plan section) Acute Rehab OT  Goals Patient Stated Goal: to work on getting tools organized OT Goal Formulation: With patient  OT Frequency:     Barriers to D/C:            Co-evaluation              AM-PAC OT "6 Clicks" Daily Activity     Outcome Measure Help from another person eating meals?: None Help from another person taking care of personal grooming?: None Help from another person toileting, which includes using toliet, bedpan, or urinal?: None Help from another person bathing (including washing, rinsing, drying)?: None Help from another person to put on and taking off regular upper body clothing?: None Help from another person to put on and taking off regular lower body clothing?: None 6 Click Score: 24   End of Session Nurse Communication: Mobility status;Precautions  Activity Tolerance: Patient tolerated treatment well Patient left: in bed;with call bell/phone within reach;with family/visitor present  OT Visit Diagnosis: Unsteadiness on feet (R26.81);Muscle weakness (generalized) (M62.81)                Time: 5456-2563 OT Time Calculation (min): 24 min Charges:  OT General Charges $OT Visit: 1 Visit OT Evaluation $OT Eval Moderate Complexity: 1 Mod OT Treatments $Self Care/Home Management : 8-22 mins   Jeri Modena, OTR/L  Acute Rehabilitation Services Pager: [phone] Office: [phone] .   Jeri Modena 09/11/2018, 9:09 AM

## 2018-09-11 NOTE — Discharge Summary (Signed)
Physician Discharge Summary  Patient ID: Clayton Braun MRN: [id number] DOB/AGE: March 04, 1951 68 y.o.  Admit date: 09/10/2018 Discharge date: 09/11/2018  Admission Diagnoses: Recurrent lumbar herniated disc, lumbago, lumbar radiculopathy  Discharge Diagnoses: The same Active Problems:   Herniated lumbar disc without myelopathy   Discharged Condition: good  Hospital Course: Dr. Vertell Limber performed a redo left L4-5 discectomy on the patient on 09/10/2018.  The patient's postoperative course was unremarkable.  On postoperative day #1 he requested discharge to home.  The patient was given written and oral discharge instructions.  All his questions were answered.  Consults: Physical therapy Significant Diagnostic Studies: None Treatments: Redo left L4-5 discectomy Discharge Exam: Blood pressure 107/62, pulse (!) 54, temperature 97.6 F (36.4 C), temperature source Oral, resp. rate 18, height 5\' 6"  (1.676 m), weight 73 kg, SpO2 97 %. The patient is alert and pleasant.  He looks well.  He is moving his lower extremities well.  Disposition: Home  Discharge Instructions    Call MD for:  difficulty breathing, headache or visual disturbances   Complete by:  As directed    Call MD for:  extreme fatigue   Complete by:  As directed    Call MD for:  hives   Complete by:  As directed    Call MD for:  persistant dizziness or light-headedness   Complete by:  As directed    Call MD for:  persistant nausea and vomiting   Complete by:  As directed    Call MD for:  redness, tenderness, or signs of infection (pain, swelling, redness, odor or green/yellow discharge around incision site)   Complete by:  As directed    Call MD for:  severe uncontrolled pain   Complete by:  As directed    Call MD for:  temperature >100.4   Complete by:  As directed    Diet - low sodium heart healthy   Complete by:  As directed    Discharge instructions   Complete by:  As directed    Call [phone] for a followup  appointment. Take a stool softener while you are using pain medications.   Driving Restrictions   Complete by:  As directed    Do not drive for 2 weeks.   Increase activity slowly   Complete by:  As directed    Lifting restrictions   Complete by:  As directed    Do not lift more than 5 pounds. No excessive bending or twisting.   May shower / Bathe   Complete by:  As directed    Remove the dressing for 3 days after surgery.  You may shower, but leave the incision alone.   Remove dressing in 48 hours   Complete by:  As directed    Your stitches are under the scan and will dissolve by themselves. The Steri-Strips will fall off after you take a few showers. Do not rub back or pick at the wound, Leave the wound alone.     Allergies as of 09/11/2018      Reactions   Niacin And Related Other (See Comments)   flushing      Medication List    STOP taking these medications   HYDROcodone-acetaminophen 5-325 MG tablet Commonly known as:  NORCO/VICODIN     TAKE these medications   ALPRAZolam 1 MG tablet Commonly known as:  XANAX Take 0.5 mg by mouth at bedtime as needed (sleep.).   aspirin EC 81 MG tablet Take 81 mg by mouth daily.  atenolol 25 MG tablet Commonly known as:  TENORMIN Take 12.5 mg by mouth daily.   atorvastatin 10 MG tablet Commonly known as:  LIPITOR Take 10 mg by mouth at bedtime.   docusate sodium 100 MG capsule Commonly known as:  COLACE Take 1 capsule (100 mg total) by mouth 2 (two) times daily.   FOLBIC 2.5-25-2 MG Tabs tablet Generic drug:  folic acid-pyridoxine-cyancobalamin Take 1 tablet by mouth Daily.   gabapentin 300 MG capsule Commonly known as:  NEURONTIN Take 1 capsule (300 mg total) by mouth 3 (three) times daily.   gemfibrozil 600 MG tablet Commonly known as:  LOPID Take 600 mg by mouth 2 (two) times daily.   lidocaine 5 % Commonly known as:  LIDODERM Place 1 patch onto the skin daily. Remove & Discard patch within 12 hours or as  directed by MD   methocarbamol 500 MG tablet Commonly known as:  ROBAXIN Take 1 tablet (500 mg total) by mouth every 6 (six) hours as needed for muscle spasms.   montelukast 10 MG tablet Commonly known as:  SINGULAIR Take 10 mg by mouth at bedtime.   multivitamin with minerals Tabs tablet Take 1 tablet by mouth daily. Centrum Silver for Men 50+   nabumetone 750 MG tablet Commonly known as:  RELAFEN Take 750 mg by mouth 2 (two) times daily.   oxyCODONE 5 MG immediate release tablet Commonly known as:  Oxy IR/ROXICODONE Take 1-2 tablets (5-10 mg total) by mouth every 4 (four) hours as needed (for pain.). What changed:    how much to take  how to take this  when to take this  reasons to take this  additional instructions   pantoprazole 40 MG tablet Commonly known as:  PROTONIX Take 40 mg by mouth daily.   PARoxetine 20 MG tablet Commonly known as:  PAXIL Take 20 mg by mouth at bedtime.   ramipril 2.5 MG capsule Commonly known as:  ALTACE Take 2.5 mg by mouth daily.   rOPINIRole 0.25 MG tablet Commonly known as:  REQUIP Take 0.25-0.5 mg by mouth at bedtime as needed (restless leg syndrome).   SYSTANE ULTRA OP Place 1 drop into both eyes daily as needed (dry eyes).        Signed: Ophelia Charter 09/11/2018, 9:41 AM

## 2018-09-11 NOTE — Evaluation (Signed)
Physical Therapy Evaluation Patient Details Name: Clayton Braun MRN: [id number] DOB: [birth year] Today's Date: 09/11/2018   History of Present Illness  Pt is a 68yo male with recurrent disc hernation at L4-5 with L LE radiating pain. 12/31 pt s/p re-do L lumbar 4-5 microdiscectomy. PSH: 03/22/18 L L4-5 microdiscetomy.  Clinical Impression  Patient is s/p above surgery resulting in the deficits listed below (see PT Problem List). Pt mobilizing well for first day after surgery. Functioning at supervision level with minimal pain. Patient will benefit from skilled PT to increase their independence and safety with mobility (while adhering to their precautions) to allow discharge to the venue listed below. Pt is interested going to outpt physical therapy once cleared by MD to potentially return to golfing. I agree with patient is afraid to do something to re-injury self again, outpatient PT for core/back strengthening will help decrease risk of re-injury.     Follow Up Recommendations No PT follow up;Supervision - Intermittent    Equipment Recommendations  None recommended by PT    Recommendations for Other Services       Precautions / Restrictions Precautions Precautions: Back Precaution Booklet Issued: Yes (comment) Precaution Comments: pt with good comprehension Restrictions Weight Bearing Restrictions: No      Mobility  Bed Mobility Overal bed mobility: Needs Assistance Bed Mobility: Rolling;Sidelying to Sit Rolling: Supervision Sidelying to sit: Supervision       General bed mobility comments: verbal cues for log roll technique, no physical assist  Transfers Overall transfer level: Needs assistance Equipment used: None Transfers: Sit to/from Stand Sit to Stand: Supervision         General transfer comment: pt with good technique, increased time but no physical assist  Ambulation/Gait Ambulation/Gait assistance: Supervision Gait Distance (Feet): 350 Feet Assistive  device: None Gait Pattern/deviations: WFL(Within Functional Limits) Gait velocity: dec Gait velocity interpretation: >2.62 ft/sec, indicative of community ambulatory General Gait Details: pt initially guarded and cautious but progressed well  Stairs            Wheelchair Mobility    Modified Rankin (Stroke Patients Only)       Balance Overall balance assessment: No apparent balance deficits (not formally assessed)(pt took self to bathroom and stood to urinate w/o difficulty)                                           Pertinent Vitals/Pain Pain Assessment: 0-10 Pain Score: 2  Pain Location: incision Pain Descriptors / Indicators: Sore Pain Intervention(s): Monitored during session    Home Living Family/patient expects to be discharged to:: Private residence Living Arrangements: Spouse/significant other Available Help at Discharge: Family;Available 24 hours/day Type of Home: House Home Access: Stairs to enter Entrance Stairs-Rails: None Entrance Stairs-Number of Steps: 1 Home Layout: Two level;Able to live on main level with bedroom/bathroom Home Equipment: Shower seat      Prior Function Level of Independence: Independent               Hand Dominance   Dominant Hand: Right    Extremity/Trunk Assessment   Upper Extremity Assessment Upper Extremity Assessment: Overall WFL for tasks assessed    Lower Extremity Assessment Lower Extremity Assessment: Overall WFL for tasks assessed    Cervical / Trunk Assessment Cervical / Trunk Assessment: Other exceptions Cervical / Trunk Exceptions: recent back surgery  Communication   Communication: No difficulties  Cognition  Arousal/Alertness: Awake/alert Behavior During Therapy: WFL for tasks assessed/performed Overall Cognitive Status: Within Functional Limits for tasks assessed                                        General Comments General comments (skin integrity,  edema, etc.): dressing intact    Exercises     Assessment/Plan    PT Assessment Patient needs continued PT services  PT Problem List Decreased strength;Decreased activity tolerance;Decreased balance;Decreased mobility       PT Treatment Interventions DME instruction;Gait training;Stair training;Functional mobility training;Therapeutic activities;Therapeutic exercise;Balance training    PT Goals (Current goals can be found in the Care Plan section)  Acute Rehab PT Goals Patient Stated Goal: home today PT Goal Formulation: With patient Time For Goal Achievement: 09/18/18 Potential to Achieve Goals: Good    Frequency Min 3X/week   Barriers to discharge        Co-evaluation               AM-PAC PT "6 Clicks" Mobility  Outcome Measure Help needed turning from your back to your side while in a flat bed without using bedrails?: None Help needed moving from lying on your back to sitting on the side of a flat bed without using bedrails?: None Help needed moving to and from a bed to a chair (including a wheelchair)?: None Help needed standing up from a chair using your arms (e.g., wheelchair or bedside chair)?: None Help needed to walk in hospital room?: None Help needed climbing 3-5 steps with a railing? : A Little 6 Click Score: 23    End of Session Equipment Utilized During Treatment: Gait belt Activity Tolerance: Patient tolerated treatment well Patient left: in chair;with family/visitor present;with call bell/phone within reach Nurse Communication: Mobility status PT Visit Diagnosis: Difficulty in walking, not elsewhere classified (R26.2)    Time: 3893-7342 PT Time Calculation (min) (ACUTE ONLY): 25 min   Charges:   PT Evaluation $PT Eval Moderate Complexity: 1 Mod PT Treatments $Gait Training: 8-22 mins        Kittie Plater, PT, DPT Acute Rehabilitation Services Pager #: [phone] Office #: [phone]   Berline Lopes 09/11/2018, 8:29 AM

## 2018-10-28 DIAGNOSIS — Z299 Encounter for prophylactic measures, unspecified: Secondary | ICD-10-CM | POA: Diagnosis not present

## 2018-10-28 DIAGNOSIS — I10 Essential (primary) hypertension: Secondary | ICD-10-CM | POA: Diagnosis not present

## 2018-10-28 DIAGNOSIS — R109 Unspecified abdominal pain: Secondary | ICD-10-CM | POA: Diagnosis not present

## 2018-10-28 DIAGNOSIS — K5732 Diverticulitis of large intestine without perforation or abscess without bleeding: Secondary | ICD-10-CM | POA: Diagnosis not present

## 2018-10-28 DIAGNOSIS — F419 Anxiety disorder, unspecified: Secondary | ICD-10-CM | POA: Diagnosis not present

## 2018-10-28 DIAGNOSIS — Z6827 Body mass index (BMI) 27.0-27.9, adult: Secondary | ICD-10-CM | POA: Diagnosis not present

## 2018-10-28 DIAGNOSIS — F17210 Nicotine dependence, cigarettes, uncomplicated: Secondary | ICD-10-CM | POA: Diagnosis not present

## 2018-11-11 DIAGNOSIS — Z720 Tobacco use: Secondary | ICD-10-CM | POA: Diagnosis not present

## 2018-11-11 DIAGNOSIS — J418 Mixed simple and mucopurulent chronic bronchitis: Secondary | ICD-10-CM | POA: Diagnosis not present

## 2018-11-11 DIAGNOSIS — R0602 Shortness of breath: Secondary | ICD-10-CM | POA: Diagnosis not present

## 2019-01-09 DIAGNOSIS — I2510 Atherosclerotic heart disease of native coronary artery without angina pectoris: Secondary | ICD-10-CM | POA: Diagnosis not present

## 2019-01-09 DIAGNOSIS — E7800 Pure hypercholesterolemia, unspecified: Secondary | ICD-10-CM | POA: Diagnosis not present

## 2019-02-14 DIAGNOSIS — E7800 Pure hypercholesterolemia, unspecified: Secondary | ICD-10-CM | POA: Diagnosis not present

## 2019-02-14 DIAGNOSIS — I2510 Atherosclerotic heart disease of native coronary artery without angina pectoris: Secondary | ICD-10-CM | POA: Diagnosis not present

## 2019-02-17 DIAGNOSIS — M5416 Radiculopathy, lumbar region: Secondary | ICD-10-CM | POA: Diagnosis not present

## 2019-02-17 DIAGNOSIS — L409 Psoriasis, unspecified: Secondary | ICD-10-CM | POA: Diagnosis not present

## 2019-02-17 DIAGNOSIS — M5126 Other intervertebral disc displacement, lumbar region: Secondary | ICD-10-CM | POA: Diagnosis not present

## 2019-02-17 DIAGNOSIS — Z79899 Other long term (current) drug therapy: Secondary | ICD-10-CM | POA: Diagnosis not present

## 2019-02-17 DIAGNOSIS — M545 Low back pain: Secondary | ICD-10-CM | POA: Diagnosis not present

## 2019-02-25 DIAGNOSIS — I10 Essential (primary) hypertension: Secondary | ICD-10-CM | POA: Diagnosis not present

## 2019-02-25 DIAGNOSIS — I2510 Atherosclerotic heart disease of native coronary artery without angina pectoris: Secondary | ICD-10-CM | POA: Diagnosis not present

## 2019-02-25 DIAGNOSIS — F419 Anxiety disorder, unspecified: Secondary | ICD-10-CM | POA: Diagnosis not present

## 2019-02-25 DIAGNOSIS — Z299 Encounter for prophylactic measures, unspecified: Secondary | ICD-10-CM | POA: Diagnosis not present

## 2019-03-05 ENCOUNTER — Telehealth: Payer: Self-pay | Admitting: *Deleted

## 2019-03-05 NOTE — Telephone Encounter (Signed)
    COVID-19 Pre-Screening Questions:  . In the past 7 to 10 days have you had a cough,  shortness of breath, headache, congestion, fever (100 or greater) body aches, chills, sore throat, or sudden loss of taste or sense of smell?-NO . Have you been around anyone with known Covid 19.-NO . Have you been around anyone who is awaiting Covid 19 test results in the past 7 to 10 days?-NO . Have you been around anyone who has been exposed to Covid 19, or has mentioned symptoms of Covid 19 within the past 7 to 10 days?-NO     Pre-covid screening questions done for pt to come in and see Kathyrn Drown NP on next Tuesday 6/30 at 2:45 pm.  Pt is aware to wear his facial mask the entire duration of his visit.  And pt is aware of no visitor policy.  Pt verbalized understanding and agrees with this plan.

## 2019-03-08 NOTE — Progress Notes (Signed)
Cardiology Office Note   Date:  03/11/2019   ID:  Clayton, Braun December 28, 1950, MRN [id number]  PCP:  Charolette Forward, MD  Cardiologist: [name], MD   Chief Complaint  Patient presents with  . Follow-up  . Coronary Artery Disease  . Hypertension    History of Present Illness: Clayton Braun is a 68 y.o. male with a hx of CAD s/p PCI to RCA 2002 and HTN.   He was last seen by [name] for new patient consultation 01/17/2018. Prior to that he had not been seen by Cardiology since 2013. He was previously followed by Dr. Ron Parker. Cardiac hx includes PCI to RCA in 2002. Cath in 2009 showed patent stent and a 50% mLAD lesion. He had a stress test 12/12/2011 that was normal with no ischemia. He had been on ASA, beta blocker and a statin.   With his prior event, he noted flushing as his anginal equivalent. He reported to [name] that he would only get this sensation when using Niacin products. He had complaints of fatigue that were not though tot be cardiac in nature. Plan was to update more recent testing given the 50% lesion in the mLAD with an exercise stress test however this was never ordered/performed.  Most recently he underwent lumbar surgery and was discharged 09/10/2018.     Today he reports he is doing well from a cardiac perspective. He denies flushing, his anginal equivalent, chest pain, palpitations, PND, N/V, dizziness or syncope. He has had some mild SOB that he states is essentially unchanged from prior. He continues to smoke 1PPD. Was followed with pulmonary in [place] however is displeased with his care. [name] and his wife are transferring their care to [facility]. Per chart review, [name] and the patient discussed stress testing given that it had been approximately 7 years since his last evaluation. We discussed the option for Lexiscan testing however he is preferring to have a coronary CT performed for more definitive testing. He would like Dr. [name] input  into the decision.  Past Medical History:  Diagnosis Date  . Abdominal pain   . Acute respiratory disease   . Allergic rhinitis   . Allergies   . Anxiety    09/09/18- not in a long time  . AP (abdominal pain)   . Blood in stool   . Body mass index 27.0-27.9, adult   . Bradycardia, sinus   . Bronchitis   . Candidiasis    ORAL  . Chest pain    March, 2013  . Coronary artery disease    Stent to RCA, 2002   /    cath 2009, stent patent, residual 50% LAD  . Depression    09/09/2018- not in a while  . Diverticulosis 2006   COLONOSCOPY DR. Lindalou Hose, H/O POLYP  . Dizziness    occasionaly  . Dyslipidemia   . Fatigue   . Gastroenteritis and colitis, viral   . GERD (gastroesophageal reflux disease)   . Hypercholesterolemia   . Hyperkalemia    Per the patient, 2013  . Hyperlipidemia   . Hypertension   . Hypertriglyceridemia   . Nummular eczema   . Osteoarthritis   . Pharyngitis   . Pityriasis rosea   . Pneumonia   . Psoriasis   . Rash   . Rhinosinusitis   . Sinusitis   . Skin neoplasm   . Tinea cruris   . Wart     Past  Surgical History:  Procedure Laterality Date  . APPENDECTOMY    . CARDIAC CATHETERIZATION     2002  . COLONOSCOPY    . ESOPHAGOGASTRODUODENOSCOPY    . EYE SURGERY     implants, cataracts  . HERNIA REPAIR Right    Inguinal  . LUMBAR LAMINECTOMY/DECOMPRESSION MICRODISCECTOMY Left 03/22/2018   Procedure: Left Lumbar four-five Microdiscectomy;  Surgeon: Erline Levine, MD;  Location: [facility];  Service: Neurosurgery;  Laterality: Left;  . LUMBAR LAMINECTOMY/DECOMPRESSION MICRODISCECTOMY Left 09/10/2018   Procedure: Redo Left Lumbar four-five Microdiscectomy;  Surgeon: Erline Levine, MD;  Location: [facility];  Service: Neurosurgery;  Laterality: Left;  . NASAL SINUS SURGERY    . PENILE PROSTHESIS PLACEMENT      Current Outpatient Medications  Medication Sig Dispense Refill  . ALPRAZolam (XANAX) 1 MG tablet Take 0.5 mg by mouth at bedtime as needed  (sleep.).   2  . aspirin EC 81 MG tablet Take 81 mg by mouth daily.    Marland Kitchen atenolol (TENORMIN) 25 MG tablet Take 12.5 mg by mouth daily.     Marland Kitchen atorvastatin (LIPITOR) 10 MG tablet Take 10 mg by mouth at bedtime.      . FOLBIC 2.5-25-2 MG TABS Take 1 tablet by mouth Daily.    Marland Kitchen gabapentin (NEURONTIN) 300 MG capsule Take 1 capsule (300 mg total) by mouth 3 (three) times daily. 90 capsule 0  . gemfibrozil (LOPID) 600 MG tablet Take 600 mg by mouth 2 (two) times daily.      . montelukast (SINGULAIR) 10 MG tablet Take 10 mg by mouth at bedtime.    . Multiple Vitamin (MULTIVITAMIN WITH MINERALS) TABS tablet Take 1 tablet by mouth daily. Centrum Silver for Men 50+    . nabumetone (RELAFEN) 750 MG tablet Take 750 mg by mouth 2 (two) times daily.  2  . pantoprazole (PROTONIX) 40 MG tablet Take 40 mg by mouth daily.    Marland Kitchen PARoxetine (PAXIL) 20 MG tablet Take 20 mg by mouth at bedtime.     Vladimir Faster Glycol-Propyl Glycol (SYSTANE ULTRA OP) Place 1 drop into both eyes daily as needed (dry eyes).    . ramipril (ALTACE) 2.5 MG capsule Take 2.5 mg by mouth daily.       No current facility-administered medications for this visit.     Allergies:   Niacin and related    Social History:  The patient  reports that he has been smoking cigarettes. He has a 36.00 pack-year smoking history. He has never used smokeless tobacco. He reports previous alcohol use. He reports that he does not use drugs.   Family History:  The patient's family history includes COPD in his father; Heart attack in his mother; Heart disease in an other family member; Lung disease in an other family member.    ROS:  Please see the history of present illness.   Otherwise, review of systems are positive for none.   All other systems are reviewed and negative.    PHYSICAL EXAM: VS:  BP 108/62   Pulse 62   Ht 5\' 6"  (1.676 m)   Wt 166 lb 12.8 oz (75.7 kg)   SpO2 99%   BMI 26.92 kg/m  , BMI Body mass index is 26.92 kg/m.   General: Well  developed, well nourished, NAD Neck: Negative for carotid bruits. No JVD Lungs:Clear to ausculation bilaterally. No wheezes, rales, or rhonchi. Breathing is unlabored. Cardiovascular: RRR with S1 S2. No murmurs, rubs,, or LV heave appreciated. Extremities: No edema.  No clubbing or cyanosis. DP/PT pulses 1+ bilaterally Neuro: Alert and oriented. No focal deficits. No facial asymmetry. MAE spontaneously. Psych: Responds to questions appropriately with normal affect.     EKG:  EKG is ordered today. The ekg ordered today demonstrates NSR with no ischemic changes   Recent Labs: 09/07/2018: BUN 23; Creatinine, Ser 0.90; Hemoglobin 14.4; Platelets 221; Potassium 4.1; Sodium 137    Lipid Panel    Component Value Date/Time   CHOL  09/26/2007 0534    117        ATP III CLASSIFICATION:  <200     mg/dL   Desirable  200-239  mg/dL   Borderline High  >=240    mg/dL   High   TRIG 191 (H) 09/26/2007 0534   HDL 24 (L) 09/26/2007 0534   CHOLHDL 4.9 09/26/2007 0534   VLDL 38 09/26/2007 0534   LDLCALC  09/26/2007 0534    55        Total Cholesterol/HDL:CHD Risk Coronary Heart Disease Risk Table                     Men   Women  1/2 Average Risk   3.4   3.3    Wt Readings from Last 3 Encounters:  03/11/19 166 lb 12.8 oz (75.7 kg)  09/10/18 161 lb (73 kg)  03/22/18 168 lb (76.2 kg)    Other studies Reviewed: Additional studies/ records that were reviewed today include:   Catheterization 01/11/2011: Left anterior descending artery:  Left anterior descending artery gave  rise to 3 diagonal branches, septal perforator.  The LAD was small in  caliber and irregular.  There was 50% narrowing in the proximal to mid-  vessel.   Circumflex artery:  The circumflex artery gave rise to a marginal branch  and a posterolateral branch.  This vessel is also small and irregular  but free of major obstruction.   The right coronary artery was a moderate-sized vessel that gave rise to  posterior  descending branch and 2 posterolateral branches.  There is a  focal 20% narrowing within the stent in the very proximal portion of the  right coronary artery.  There are irregularities in the rest of the  vessel but no significant obstruction.   The left ventriculogram performed in the RAO projection showed good wall  motion with no areas of hypokinesis.  The estimated fraction is 60%.   Aortic pressure was 108/65 with a mean of 83 and left ventricle pressure  108/14.   CONCLUSION:  Nonobstructive coronary artery disease with 50% narrowing  in the proximal LAD, no significant obstruction of circumflex artery,  20% narrowing at the stent in the proximal right coronary and normal LV  function.   Stress test from 12/12/2011 with no significant abnormality   Echocardiogram from 11/13/2011>>results in Epic but are scanned and unable to be transferred into current chart    ASSESSMENT AND PLAN:  1. CAD with PCI to RCA 2002: -Remote hx of PCi to RCA in 2002 patent per cath reports from 2009 with known residual disease in the mLAD region. Stress test from 2013 with no ischemia.  -Plan at last visit was to update ischemic workup given residual disease -Will discuss Lexiscan Myoview versus CTA with [name] per patient request and plan accordingly -Continue ASA, beta blocker, statin   2. HTN: -Well controlled, 108/62 -Continue current regimen   3. HLD: -Labs noted per PCP>>>in the process of establishing with new PCP in  [place]>>to have labs sent to our office when complete   4. Anxiety/Depression: -Stable and controlled on Paxil  5. GERD: -On Protonix -Stable, watches triggers   6. Tobacco Use: -Was followed with pulmonary medicine in [place], [place]>>wants to establish here  -Will send referral  -Needs chest CT for yearly evaluation given long hx of tobacco use -Cessation strongly encouraged    Current medicines are reviewed at length with the patient today.  The patient does  not have concerns regarding medicines.  The following changes have been made:  no change  Labs/ tests ordered today include: None   Orders Placed This Encounter  Procedures  . EKG 12-Lead   Disposition:   FU with [name] in 1 year  Signed, Kathyrn Drown, NP  03/11/2019 3:22 PM    [facility] [address], [place], [place]  [address] Phone: [phone]; Fax: [phone]

## 2019-03-10 ENCOUNTER — Telehealth: Payer: Self-pay | Admitting: Cardiology

## 2019-03-10 NOTE — Telephone Encounter (Signed)

## 2019-03-11 ENCOUNTER — Encounter: Payer: Self-pay | Admitting: Cardiology

## 2019-03-11 ENCOUNTER — Ambulatory Visit (INDEPENDENT_AMBULATORY_CARE_PROVIDER_SITE_OTHER): Payer: Medicare Other | Admitting: Cardiology

## 2019-03-11 ENCOUNTER — Other Ambulatory Visit: Payer: Self-pay

## 2019-03-11 VITALS — BP 108/62 | HR 62 | Ht 66.0 in | Wt 166.8 lb

## 2019-03-11 DIAGNOSIS — I10 Essential (primary) hypertension: Secondary | ICD-10-CM | POA: Diagnosis not present

## 2019-03-11 DIAGNOSIS — E785 Hyperlipidemia, unspecified: Secondary | ICD-10-CM

## 2019-03-11 DIAGNOSIS — Z720 Tobacco use: Secondary | ICD-10-CM

## 2019-03-11 DIAGNOSIS — I2583 Coronary atherosclerosis due to lipid rich plaque: Secondary | ICD-10-CM

## 2019-03-11 DIAGNOSIS — I2510 Atherosclerotic heart disease of native coronary artery without angina pectoris: Secondary | ICD-10-CM

## 2019-03-11 NOTE — Patient Instructions (Signed)
Medication Instructions:  Your physician recommends that you continue on your current medications as directed. Please refer to the Current Medication list given to you today.  If you need a refill on your cardiac medications before your next appointment, please call your pharmacy.   Lab work: None ordered  If you have labs (blood work) drawn today and your tests are completely normal, you will receive your results only by: Marland Kitchen MyChart Message (if you have MyChart) OR . A paper copy in the mail If you have any lab test that is abnormal or we need to change your treatment, we will call you to review the results.  Testing/Procedures: None ordered  Follow-Up: At [facility], you and your health needs are our priority.  As part of our continuing mission to provide you with exceptional heart care, we have created designated Provider Care Teams.  These Care Teams include your primary Cardiologist (physician) and Advanced Practice Providers (APPs -  Physician Assistants and Nurse Practitioners) who all work together to provide you with the care you need, when you need it. You will need a follow up appointment in 12 months.  Please call our office 2 months in advance to schedule this appointment.  You may see Jenkins Rouge, MD or one of the following Advanced Practice Providers on your designated Care Team:   Truitt Merle, NP Cecilie Kicks, NP . Kathyrn Drown, NP  Any Other Special Instructions Will Be Listed Below (If Applicable).

## 2019-03-12 ENCOUNTER — Telehealth: Payer: Self-pay

## 2019-03-12 DIAGNOSIS — I2510 Atherosclerotic heart disease of native coronary artery without angina pectoris: Secondary | ICD-10-CM

## 2019-03-12 DIAGNOSIS — I2583 Coronary atherosclerosis due to lipid rich plaque: Secondary | ICD-10-CM

## 2019-03-12 NOTE — Telephone Encounter (Signed)
-----   Message from Tommie Raymond, NP sent at 03/12/2019  1:10 PM EDT ----- Regarding: stress test Please let the patient know that I discussed the situation with [name] who feels that he should have the [place] stress test. Per [name], we have started doing exercise stress tests in the office now? If so, he would prefer this. Please let the patient know that he is not a candidate for the Coronary CTA due to his stent placement. We would get more information from the ETT or Lexiscan Myovue per [name].   Thank you Sharee Pimple

## 2019-03-12 NOTE — Telephone Encounter (Signed)
Left a message for the patient to call back.  

## 2019-03-12 NOTE — Telephone Encounter (Signed)
Spoke with the patient, he accepted having a Lexiscan stress test.   You are scheduled for a Myocardial Perfusion Imaging Study. Please arrive 15 minutes prior to your appointment time for registration and insurance purposes.  The test will take approximately 3 to 4 hours to complete; you may bring reading material.  If someone comes with you to your appointment, they will need to remain in the main lobby due to limited space in the testing area. **If you are pregnant or breastfeeding, please notify the nuclear lab prior to your appointment**  How to prepare for your Myocardial Perfusion Test: . Do not eat or drink 3 hours prior to your test, except you may have water. . Do not consume products containing caffeine (regular or decaffeinated) 12 hours prior to your test. (ex: coffee, chocolate, sodas, tea). . Do wear comfortable clothes (no dresses or overalls) and walking shoes, tennis shoes preferred (No heels or open toe shoes are allowed). . Do NOT wear cologne, perfume, aftershave, or lotions (deodorant is allowed). . If these instructions are not followed, your test will have to be rescheduled.  Please report to [address], [address] for your test.  If you have questions or concerns about your appointment, you can call the Nuclear Lab at [phone].  If you cannot keep your appointment, please provide 24 hours notification to the Nuclear Lab, to avoid a possible $50 charge to your account.  He verbally expressed understanding about the instructions and denied having physical copy. Sending a message to scheduling.

## 2019-03-12 NOTE — Telephone Encounter (Signed)
Follow up ° ° °Patient is returning call. Please call. °

## 2019-03-18 ENCOUNTER — Telehealth (HOSPITAL_COMMUNITY): Payer: Self-pay | Admitting: *Deleted

## 2019-03-18 NOTE — Telephone Encounter (Signed)
Patient given detailed instructions per Myocardial Perfusion Study Information Sheet for the test on 03/21/19 at 10:45. Patient notified to arrive 15 minutes early and that it is imperative to arrive on time for appointment to keep from having the test rescheduled.  If you need to cancel or reschedule your appointment, please call the office within 24 hours of your appointment. . Patient verbalized understanding.Clayton Braun

## 2019-03-21 ENCOUNTER — Ambulatory Visit (HOSPITAL_COMMUNITY): Payer: Medicare Other | Attending: Cardiology

## 2019-03-21 ENCOUNTER — Other Ambulatory Visit: Payer: Self-pay

## 2019-03-21 DIAGNOSIS — I2510 Atherosclerotic heart disease of native coronary artery without angina pectoris: Secondary | ICD-10-CM | POA: Insufficient documentation

## 2019-03-21 DIAGNOSIS — I2583 Coronary atherosclerosis due to lipid rich plaque: Secondary | ICD-10-CM | POA: Insufficient documentation

## 2019-03-21 LAB — MYOCARDIAL PERFUSION IMAGING
LV dias vol: 74 mL (ref 62–150)
LV sys vol: 22 mL
Peak HR: 74 {beats}/min
Rest HR: 46 {beats}/min
SDS: 0
SRS: 0
SSS: 0
TID: 1.02

## 2019-03-21 MED ORDER — REGADENOSON 0.4 MG/5ML IV SOLN
0.4000 mg | Freq: Once | INTRAVENOUS | Status: AC
  Administered 2019-03-21: 0.4 mg via INTRAVENOUS

## 2019-03-21 MED ORDER — TECHNETIUM TC 99M TETROFOSMIN IV KIT
10.9000 | PACK | Freq: Once | INTRAVENOUS | Status: AC | PRN
  Administered 2019-03-21: 10.9 via INTRAVENOUS
  Filled 2019-03-21: qty 11

## 2019-03-21 MED ORDER — TECHNETIUM TC 99M TETROFOSMIN IV KIT
31.9000 | PACK | Freq: Once | INTRAVENOUS | Status: AC | PRN
  Administered 2019-03-21: 31.9 via INTRAVENOUS
  Filled 2019-03-21: qty 32

## 2019-03-24 ENCOUNTER — Telehealth: Payer: Self-pay

## 2019-03-24 DIAGNOSIS — Z8719 Personal history of other diseases of the digestive system: Secondary | ICD-10-CM | POA: Diagnosis not present

## 2019-03-24 DIAGNOSIS — K648 Other hemorrhoids: Secondary | ICD-10-CM | POA: Diagnosis not present

## 2019-03-24 DIAGNOSIS — Z1211 Encounter for screening for malignant neoplasm of colon: Secondary | ICD-10-CM | POA: Diagnosis not present

## 2019-03-24 DIAGNOSIS — Z86010 Personal history of colonic polyps: Secondary | ICD-10-CM | POA: Diagnosis not present

## 2019-03-24 DIAGNOSIS — K5730 Diverticulosis of large intestine without perforation or abscess without bleeding: Secondary | ICD-10-CM | POA: Diagnosis not present

## 2019-03-24 NOTE — Telephone Encounter (Signed)
Notes recorded by Frederik Schmidt, RN on 03/24/2019 at 8:29 AM EDT  The patient has been notified of the result and verbalized understanding. All questions (if any) were answered.  Frederik Schmidt, RN 03/24/2019 8:29 AM   ------

## 2019-03-24 NOTE — Telephone Encounter (Signed)
-----   Message from Clayton Raymond, NP sent at 03/24/2019  7:38 AM EDT ----- Please let the patient know that his stress test looked excellent with no change from prior stress test from 2013. His squeeze function is great

## 2019-04-24 DIAGNOSIS — R062 Wheezing: Secondary | ICD-10-CM | POA: Diagnosis not present

## 2019-04-24 DIAGNOSIS — F17200 Nicotine dependence, unspecified, uncomplicated: Secondary | ICD-10-CM | POA: Diagnosis not present

## 2019-04-24 DIAGNOSIS — G479 Sleep disorder, unspecified: Secondary | ICD-10-CM | POA: Diagnosis not present

## 2019-04-24 DIAGNOSIS — J302 Other seasonal allergic rhinitis: Secondary | ICD-10-CM | POA: Diagnosis not present

## 2019-04-24 DIAGNOSIS — Z1339 Encounter for screening examination for other mental health and behavioral disorders: Secondary | ICD-10-CM | POA: Diagnosis not present

## 2019-04-24 DIAGNOSIS — R001 Bradycardia, unspecified: Secondary | ICD-10-CM | POA: Diagnosis not present

## 2019-04-24 DIAGNOSIS — K5792 Diverticulitis of intestine, part unspecified, without perforation or abscess without bleeding: Secondary | ICD-10-CM | POA: Diagnosis not present

## 2019-04-24 DIAGNOSIS — I2510 Atherosclerotic heart disease of native coronary artery without angina pectoris: Secondary | ICD-10-CM | POA: Diagnosis not present

## 2019-04-24 DIAGNOSIS — F419 Anxiety disorder, unspecified: Secondary | ICD-10-CM | POA: Diagnosis not present

## 2019-04-24 DIAGNOSIS — R5383 Other fatigue: Secondary | ICD-10-CM | POA: Diagnosis not present

## 2019-04-24 DIAGNOSIS — Z1331 Encounter for screening for depression: Secondary | ICD-10-CM | POA: Diagnosis not present

## 2019-04-24 DIAGNOSIS — L409 Psoriasis, unspecified: Secondary | ICD-10-CM | POA: Diagnosis not present

## 2019-04-24 DIAGNOSIS — I10 Essential (primary) hypertension: Secondary | ICD-10-CM | POA: Diagnosis not present

## 2019-05-20 DIAGNOSIS — Z125 Encounter for screening for malignant neoplasm of prostate: Secondary | ICD-10-CM | POA: Diagnosis not present

## 2019-05-20 DIAGNOSIS — E7849 Other hyperlipidemia: Secondary | ICD-10-CM | POA: Diagnosis not present

## 2019-05-22 DIAGNOSIS — F17200 Nicotine dependence, unspecified, uncomplicated: Secondary | ICD-10-CM | POA: Diagnosis not present

## 2019-05-22 DIAGNOSIS — D7589 Other specified diseases of blood and blood-forming organs: Secondary | ICD-10-CM | POA: Diagnosis not present

## 2019-05-22 DIAGNOSIS — I2510 Atherosclerotic heart disease of native coronary artery without angina pectoris: Secondary | ICD-10-CM | POA: Diagnosis not present

## 2019-05-22 DIAGNOSIS — E785 Hyperlipidemia, unspecified: Secondary | ICD-10-CM | POA: Diagnosis not present

## 2019-05-22 DIAGNOSIS — M549 Dorsalgia, unspecified: Secondary | ICD-10-CM | POA: Diagnosis not present

## 2019-05-22 DIAGNOSIS — F062 Psychotic disorder with delusions due to known physiological condition: Secondary | ICD-10-CM | POA: Diagnosis not present

## 2019-05-27 DIAGNOSIS — R062 Wheezing: Secondary | ICD-10-CM | POA: Diagnosis not present

## 2019-05-27 DIAGNOSIS — Z0000 Encounter for general adult medical examination without abnormal findings: Secondary | ICD-10-CM | POA: Diagnosis not present

## 2019-05-27 DIAGNOSIS — E785 Hyperlipidemia, unspecified: Secondary | ICD-10-CM | POA: Diagnosis not present

## 2019-05-27 DIAGNOSIS — D7589 Other specified diseases of blood and blood-forming organs: Secondary | ICD-10-CM | POA: Diagnosis not present

## 2019-05-27 DIAGNOSIS — F17200 Nicotine dependence, unspecified, uncomplicated: Secondary | ICD-10-CM | POA: Diagnosis not present

## 2019-05-27 DIAGNOSIS — R5383 Other fatigue: Secondary | ICD-10-CM | POA: Diagnosis not present

## 2019-05-27 DIAGNOSIS — Z20818 Contact with and (suspected) exposure to other bacterial communicable diseases: Secondary | ICD-10-CM | POA: Diagnosis not present

## 2019-05-27 DIAGNOSIS — I2510 Atherosclerotic heart disease of native coronary artery without angina pectoris: Secondary | ICD-10-CM | POA: Diagnosis not present

## 2019-05-27 DIAGNOSIS — Z23 Encounter for immunization: Secondary | ICD-10-CM | POA: Diagnosis not present

## 2019-05-27 DIAGNOSIS — M5489 Other dorsalgia: Secondary | ICD-10-CM | POA: Diagnosis not present

## 2019-05-27 DIAGNOSIS — I10 Essential (primary) hypertension: Secondary | ICD-10-CM | POA: Diagnosis not present

## 2019-05-28 ENCOUNTER — Other Ambulatory Visit (HOSPITAL_COMMUNITY): Payer: Self-pay | Admitting: Respiratory Therapy

## 2019-05-29 ENCOUNTER — Other Ambulatory Visit (HOSPITAL_COMMUNITY): Payer: Self-pay | Admitting: Respiratory Therapy

## 2019-05-30 ENCOUNTER — Other Ambulatory Visit: Payer: Self-pay | Admitting: Internal Medicine

## 2019-05-30 DIAGNOSIS — Z0000 Encounter for general adult medical examination without abnormal findings: Secondary | ICD-10-CM

## 2019-06-12 ENCOUNTER — Other Ambulatory Visit (HOSPITAL_COMMUNITY): Payer: Self-pay | Admitting: Respiratory Therapy

## 2019-06-12 DIAGNOSIS — R062 Wheezing: Secondary | ICD-10-CM

## 2019-06-12 DIAGNOSIS — M8589 Other specified disorders of bone density and structure, multiple sites: Secondary | ICD-10-CM | POA: Diagnosis not present

## 2019-06-12 DIAGNOSIS — F17200 Nicotine dependence, unspecified, uncomplicated: Secondary | ICD-10-CM

## 2019-06-23 ENCOUNTER — Ambulatory Visit
Admission: RE | Admit: 2019-06-23 | Discharge: 2019-06-23 | Disposition: A | Discharge status: Home / Self Care | Payer: Medicare Other | Source: Ambulatory Visit | Attending: Internal Medicine | Admitting: Internal Medicine

## 2019-06-23 DIAGNOSIS — Z87891 Personal history of nicotine dependence: Secondary | ICD-10-CM | POA: Diagnosis not present

## 2019-06-23 DIAGNOSIS — Z0000 Encounter for general adult medical examination without abnormal findings: Secondary | ICD-10-CM

## 2019-07-02 ENCOUNTER — Other Ambulatory Visit: Payer: Self-pay | Admitting: Internal Medicine

## 2019-07-02 DIAGNOSIS — R911 Solitary pulmonary nodule: Secondary | ICD-10-CM

## 2019-07-24 DIAGNOSIS — E663 Overweight: Secondary | ICD-10-CM | POA: Diagnosis not present

## 2019-07-24 DIAGNOSIS — R062 Wheezing: Secondary | ICD-10-CM | POA: Diagnosis not present

## 2019-07-24 DIAGNOSIS — E7849 Other hyperlipidemia: Secondary | ICD-10-CM | POA: Diagnosis not present

## 2019-07-24 DIAGNOSIS — R197 Diarrhea, unspecified: Secondary | ICD-10-CM | POA: Diagnosis not present

## 2019-07-24 DIAGNOSIS — R918 Other nonspecific abnormal finding of lung field: Secondary | ICD-10-CM | POA: Diagnosis not present

## 2019-07-24 DIAGNOSIS — K5792 Diverticulitis of intestine, part unspecified, without perforation or abscess without bleeding: Secondary | ICD-10-CM | POA: Diagnosis not present

## 2019-07-24 DIAGNOSIS — F17200 Nicotine dependence, unspecified, uncomplicated: Secondary | ICD-10-CM | POA: Diagnosis not present

## 2019-08-17 DIAGNOSIS — Z888 Allergy status to other drugs, medicaments and biological substances status: Secondary | ICD-10-CM | POA: Diagnosis not present

## 2019-08-17 DIAGNOSIS — Z20828 Contact with and (suspected) exposure to other viral communicable diseases: Secondary | ICD-10-CM | POA: Diagnosis not present

## 2019-08-17 DIAGNOSIS — Z79899 Other long term (current) drug therapy: Secondary | ICD-10-CM | POA: Diagnosis not present

## 2019-08-17 DIAGNOSIS — K219 Gastro-esophageal reflux disease without esophagitis: Secondary | ICD-10-CM | POA: Diagnosis not present

## 2019-08-17 DIAGNOSIS — K5732 Diverticulitis of large intestine without perforation or abscess without bleeding: Secondary | ICD-10-CM | POA: Diagnosis not present

## 2019-08-17 DIAGNOSIS — I10 Essential (primary) hypertension: Secondary | ICD-10-CM | POA: Diagnosis not present

## 2019-08-17 DIAGNOSIS — F17210 Nicotine dependence, cigarettes, uncomplicated: Secondary | ICD-10-CM | POA: Diagnosis not present

## 2019-08-17 DIAGNOSIS — E7800 Pure hypercholesterolemia, unspecified: Secondary | ICD-10-CM | POA: Diagnosis not present

## 2019-10-02 DIAGNOSIS — Z23 Encounter for immunization: Secondary | ICD-10-CM | POA: Diagnosis not present

## 2019-10-02 DIAGNOSIS — L409 Psoriasis, unspecified: Secondary | ICD-10-CM | POA: Diagnosis not present

## 2019-10-02 DIAGNOSIS — L570 Actinic keratosis: Secondary | ICD-10-CM | POA: Diagnosis not present

## 2019-10-27 DIAGNOSIS — R918 Other nonspecific abnormal finding of lung field: Secondary | ICD-10-CM | POA: Diagnosis not present

## 2019-10-27 DIAGNOSIS — R197 Diarrhea, unspecified: Secondary | ICD-10-CM | POA: Diagnosis not present

## 2019-10-27 DIAGNOSIS — G479 Sleep disorder, unspecified: Secondary | ICD-10-CM | POA: Diagnosis not present

## 2019-10-27 DIAGNOSIS — E663 Overweight: Secondary | ICD-10-CM | POA: Diagnosis not present

## 2019-10-27 DIAGNOSIS — R5383 Other fatigue: Secondary | ICD-10-CM | POA: Diagnosis not present

## 2019-10-27 DIAGNOSIS — R062 Wheezing: Secondary | ICD-10-CM | POA: Diagnosis not present

## 2019-11-25 DIAGNOSIS — L400 Psoriasis vulgaris: Secondary | ICD-10-CM | POA: Diagnosis not present

## 2019-11-25 DIAGNOSIS — H61001 Unspecified perichondritis of right external ear: Secondary | ICD-10-CM | POA: Diagnosis not present

## 2019-11-25 DIAGNOSIS — L570 Actinic keratosis: Secondary | ICD-10-CM | POA: Diagnosis not present

## 2019-12-16 DIAGNOSIS — R2689 Other abnormalities of gait and mobility: Secondary | ICD-10-CM | POA: Diagnosis not present

## 2019-12-16 DIAGNOSIS — R5383 Other fatigue: Secondary | ICD-10-CM | POA: Diagnosis not present

## 2019-12-16 DIAGNOSIS — R42 Dizziness and giddiness: Secondary | ICD-10-CM | POA: Diagnosis not present

## 2019-12-16 DIAGNOSIS — F17200 Nicotine dependence, unspecified, uncomplicated: Secondary | ICD-10-CM | POA: Diagnosis not present

## 2019-12-16 DIAGNOSIS — I10 Essential (primary) hypertension: Secondary | ICD-10-CM | POA: Diagnosis not present

## 2019-12-16 DIAGNOSIS — F329 Major depressive disorder, single episode, unspecified: Secondary | ICD-10-CM | POA: Diagnosis not present

## 2019-12-20 DIAGNOSIS — Z23 Encounter for immunization: Secondary | ICD-10-CM | POA: Diagnosis not present

## 2019-12-23 DIAGNOSIS — F17200 Nicotine dependence, unspecified, uncomplicated: Secondary | ICD-10-CM | POA: Diagnosis not present

## 2019-12-23 DIAGNOSIS — E663 Overweight: Secondary | ICD-10-CM | POA: Diagnosis not present

## 2019-12-23 DIAGNOSIS — F331 Major depressive disorder, recurrent, moderate: Secondary | ICD-10-CM | POA: Diagnosis not present

## 2019-12-23 DIAGNOSIS — R42 Dizziness and giddiness: Secondary | ICD-10-CM | POA: Diagnosis not present

## 2019-12-23 DIAGNOSIS — F419 Anxiety disorder, unspecified: Secondary | ICD-10-CM | POA: Diagnosis not present

## 2019-12-23 DIAGNOSIS — R918 Other nonspecific abnormal finding of lung field: Secondary | ICD-10-CM | POA: Diagnosis not present

## 2019-12-23 DIAGNOSIS — G479 Sleep disorder, unspecified: Secondary | ICD-10-CM | POA: Diagnosis not present

## 2019-12-23 DIAGNOSIS — R2689 Other abnormalities of gait and mobility: Secondary | ICD-10-CM | POA: Diagnosis not present

## 2019-12-23 DIAGNOSIS — J302 Other seasonal allergic rhinitis: Secondary | ICD-10-CM | POA: Diagnosis not present

## 2019-12-23 DIAGNOSIS — R5383 Other fatigue: Secondary | ICD-10-CM | POA: Diagnosis not present

## 2019-12-23 DIAGNOSIS — R2681 Unsteadiness on feet: Secondary | ICD-10-CM | POA: Diagnosis not present

## 2019-12-24 DIAGNOSIS — I10 Essential (primary) hypertension: Secondary | ICD-10-CM | POA: Diagnosis not present

## 2019-12-24 DIAGNOSIS — R002 Palpitations: Secondary | ICD-10-CM | POA: Diagnosis not present

## 2019-12-29 NOTE — Progress Notes (Signed)
Cardiology Office Note   Date:  12/30/2019   ID:  Nicasio, Yarger 1951-09-11, MRN [id number]  PCP:  Sueanne Margarita, DO  Cardiologist:  [name], MD   Chief Complaint  Patient presents with  . Follow-up    History of Present Illness: Clayton Braun is a 69 y.o. male who presents for 6 month follow up, palpitations.   He was last seen by [name] for new patient consultation 01/17/2018. Prior to that he had not been seen by Cardiology since 2013. He was previously followed by Dr. Ron Parker. Cardiac hx includes PCI to RCA in 2002. Cath in 2009 showed patent stent and a 50% mLAD lesion. He had a stress test 12/12/2011 that was normal with no ischemia.   Anginal equivalent noted to be flushing sensation.  At previous office visit, had complaints of fatigue felt not to be cardiac related however given known 50% mLAD lesion plan was to update recent testing with exercise stress test however this was never ordered/performed.  On last evaluation, he was doing well from a CV standpoint.  He had no further flushing episodes. He had some mild SOB that he states is essentially unchanged from prior. He continued to smoke 1PPD. Was followed with pulmonary in [place] however is displeased with his care. Clayton Braun and his wife are transferring their care to [facility]. Per chart review, [name] and the patient discussed stress testing given that it had been approximately 7 years since his last evaluation. We discussed the option for Lexiscan testing.   This was performed 03/21/2019 with no ST segment deviation, considered a normal low risk study with no change compared to prior testing 12/12/2011.  Unfortunately today, Clayton Braun states that for approximately one month he has been having increased fatigue and DOE. Activities which were previously easy for him have now become more difficult.  He reports he was walking with his wife one evening and he became so fatigued and SOB that he could not make it  back to the house and she had to come pick him up. Also reports difficulty with completeing tasks like carrying the trash cans or mowing the grass which were easy for him two months prior. He has had no anginal symptoms or flushing which are his typical anginal equivalent symptoms.  Per cath review  from 2009, he does have a 50% mLAD lesion which could be the culprit of his symptoms. He denies orthopnea, LE edema, palpitations, dizziness or syncope.  Past Medical History:  Diagnosis Date  . Abdominal pain   . Acute respiratory disease   . Allergic rhinitis   . Allergies   . Anxiety    09/09/18- not in a long time  . AP (abdominal pain)   . Blood in stool   . Body mass index 27.0-27.9, adult   . Bradycardia, sinus   . Bronchitis   . Candidiasis    ORAL  . Chest pain    March, 2013  . Coronary artery disease    Stent to RCA, 2002   /    cath 2009, stent patent, residual 50% LAD  . Depression    09/09/2018- not in a while  . Diverticulosis 2006   COLONOSCOPY DR. Lindalou Hose, H/O POLYP  . Dizziness    occasionaly  . Dyslipidemia   . Fatigue   . Gastroenteritis and colitis, viral   . GERD (gastroesophageal reflux disease)   . Hypercholesterolemia   . Hyperkalemia  Per the patient, 2013  . Hyperlipidemia   . Hypertension   . Hypertriglyceridemia   . Nummular eczema   . Osteoarthritis   . Pharyngitis   . Pityriasis rosea   . Pneumonia   . Psoriasis   . Rash   . Rhinosinusitis   . Sinusitis   . Skin neoplasm   . Tinea cruris   . Wart     Past Surgical History:  Procedure Laterality Date  . APPENDECTOMY    . CARDIAC CATHETERIZATION     2002  . COLONOSCOPY    . ESOPHAGOGASTRODUODENOSCOPY    . EYE SURGERY     implants, cataracts  . HERNIA REPAIR Right    Inguinal  . LUMBAR LAMINECTOMY/DECOMPRESSION MICRODISCECTOMY Left 03/22/2018   Procedure: Left Lumbar four-five Microdiscectomy;  Surgeon: Erline Levine, MD;  Location: [facility];  Service: Neurosurgery;  Laterality:  Left;  . LUMBAR LAMINECTOMY/DECOMPRESSION MICRODISCECTOMY Left 09/10/2018   Procedure: Redo Left Lumbar four-five Microdiscectomy;  Surgeon: Erline Levine, MD;  Location: [facility];  Service: Neurosurgery;  Laterality: Left;  . NASAL SINUS SURGERY    . PENILE PROSTHESIS PLACEMENT       Current Outpatient Medications  Medication Sig Dispense Refill  . ALPRAZolam (XANAX) 1 MG tablet Take 0.5 mg by mouth at bedtime as needed (sleep.).   2  . aspirin EC 81 MG tablet Take 81 mg by mouth daily.    Marland Kitchen atenolol (TENORMIN) 25 MG tablet Take 12.5 mg by mouth daily.     Marland Kitchen atorvastatin (LIPITOR) 10 MG tablet Take 10 mg by mouth at bedtime.      . FOLBIC 2.5-25-2 MG TABS Take 1 tablet by mouth Daily.    Marland Kitchen gabapentin (NEURONTIN) 300 MG capsule Take 1 capsule (300 mg total) by mouth 3 (three) times daily. 90 capsule 0  . gemfibrozil (LOPID) 600 MG tablet Take 600 mg by mouth 2 (two) times daily.      . montelukast (SINGULAIR) 10 MG tablet Take 10 mg by mouth at bedtime.    . Multiple Vitamin (MULTIVITAMIN WITH MINERALS) TABS tablet Take 1 tablet by mouth daily. Centrum Silver for Men 50+    . nabumetone (RELAFEN) 750 MG tablet Take 750 mg by mouth 2 (two) times daily.  2  . pantoprazole (PROTONIX) 40 MG tablet Take 40 mg by mouth daily.    Marland Kitchen PARoxetine (PAXIL) 20 MG tablet Take 40 mg by mouth at bedtime.     Vladimir Faster Glycol-Propyl Glycol (SYSTANE ULTRA OP) Place 1 drop into both eyes daily as needed (dry eyes).    . ramipril (ALTACE) 2.5 MG capsule Take 2.5 mg by mouth daily.       No current facility-administered medications for this visit.    Allergies:   Niacin and related    Social History:  The patient  reports that he has been smoking cigarettes. He has a 36.00 pack-year smoking history. He has never used smokeless tobacco. He reports previous alcohol use. He reports that he does not use drugs.   Family History:  The patient's family history includes COPD in his father; Heart attack in his  mother; Heart disease in an other family member; Lung disease in an other family member.    ROS:  Please see the history of present illness.   Otherwise, review of systems are positive for none.  All other systems are reviewed and negative.    PHYSICAL EXAM: VS:  BP 116/60   Pulse 62   Ht 5\' 6"  (  1.676 m)   Wt 161 lb 12.8 oz (73.4 kg)   SpO2 98%   BMI 26.12 kg/m  , BMI Body mass index is 26.12 kg/m.   General: Well developed, well nourished, NAD Neck: Negative for carotid bruits. No JVD Lungs:Clear to ausculation bilaterally. No wheezes, rales, or rhonchi. Breathing is unlabored. Cardiovascular: RRR with S1 S2. No murmurs Extremities: No edema. Radial pulses 2+ bilaterally Neuro: Alert and oriented. No focal deficits. No facial asymmetry. MAE spontaneously. Psych: Responds to questions appropriately with normal affect.     EKG:  EKG is not ordered today. The ekg ordered today demonstrates NSR with no acute changes, HR 62 bpm   Recent Labs: No results found for requested labs within last 8760 hours.    Lipid Panel    Component Value Date/Time   CHOL  09/26/2007 0534    117        ATP III CLASSIFICATION:  <200     mg/dL   Desirable  200-239  mg/dL   Borderline High  >=240    mg/dL   High   TRIG 191 (H) 09/26/2007 0534   HDL 24 (L) 09/26/2007 0534   CHOLHDL 4.9 09/26/2007 0534   VLDL 38 09/26/2007 0534   LDLCALC  09/26/2007 0534    55        Total Cholesterol/HDL:CHD Risk Coronary Heart Disease Risk Table                     Men   Women  1/2 Average Risk   3.4   3.3    Wt Readings from Last 3 Encounters:  12/30/19 161 lb 12.8 oz (73.4 kg)  03/21/19 166 lb (75.3 kg)  03/11/19 166 lb 12.8 oz (75.7 kg)     Other studies Reviewed: Additional studies/ records that were reviewed today include:   Catheterization 01/11/2011: Left anterior descending artery: Left anterior descending artery gave rise to 3 diagonal branches, septal perforator. The LAD was small  in caliber and irregular. There was 50% narrowing in the proximal to mid- vessel.  Circumflex artery: The circumflex artery gave rise to a marginal branch and a posterolateral branch. This vessel is also small and irregular but free of major obstruction.  The right coronary artery was a moderate-sized vessel that gave rise to posterior descending branch and 2 posterolateral branches. There is a focal 20% narrowing within the stent in the very proximal portion of the right coronary artery. There are irregularities in the rest of the vessel but no significant obstruction.  The left ventriculogram performed in the RAO projection showed good wall motion with no areas of hypokinesis. The estimated fraction is 60%.  Aortic pressure was 108/65 with a mean of 83 and left ventricle pressure 108/14.  CONCLUSION: Nonobstructive coronary artery disease with 50% narrowing in the proximal LAD, no significant obstruction of circumflex artery, 20% narrowing at the stent in the proximal right coronary and normal LV function.   Stress test from 12/12/2011 with no significant abnormality   Echocardiogram from 11/13/2011>>results in Epic but are scanned and unable to be transferred into current chart   Lexiscan stress test 03/21/2019:   The left ventricular ejection fraction is hyperdynamic (>65%).  Nuclear stress EF: 70%.  There was no ST segment deviation noted during stress.  The study is normal.  This is a low risk study.  No change compared to prior study 12-12-11.    ASSESSMENT AND PLAN:  1. CAD with PCI to RCA 2002: -  Remote hx of PCi to RCA in 2002 patent per cath reports from 2009 with known residual 50% disease in the mLAD region. Stress test from 2013 with no ischemia.  -Plan at last visit was to update ischemic workup given residual disease>> patient has new onset of DOE and fatigue therefore we will proceed with LHC for further evaluation given  the above -Continue ASA, beta blocker, statin   2. HTN: -Well controlled, 116/60 -Continue current regimen   3. HLD: -Labs noted per PCP  4. Anxiety/Depression: -Stable and controlled on Paxil  5. GERD: -On Protonix -Stable, watches triggers   6. Tobacco Use: -Was followed with pulmonary medicine in [place], [place] -Has CT planned for next week for routine evaluation  -Cessation strongly encouraged    Current medicines are reviewed at length with the patient today.  The patient does not have concerns regarding medicines.  The following changes have been made:  no change  Labs/ tests ordered today include: BMET, CBC, COVID, and catheterization No orders of the defined types were placed in this encounter.  Disposition:   FU with [name] or APP in 4 weeks  Signed, Kathyrn Drown, NP  12/30/2019 10:04 AM    [address], [place], [place]  [address] Phone: [phone]; Fax: [phone]

## 2019-12-29 NOTE — H&P (View-Only) (Signed)
Cardiology Office Note   Date:  12/30/2019   ID:  Shermon, Edwardsen 09-08-1951, MRN [id number]  PCP:  Sueanne Margarita, DO  Cardiologist:  [name], MD   Chief Complaint  Patient presents with  . Follow-up    History of Present Illness: Clayton Braun is a 69 y.o. male who presents for 6 month follow up, palpitations.   He was last seen by [name] for new patient consultation 01/17/2018. Prior to that he had not been seen by Cardiology since 2013. He was previously followed by Dr. Ron Parker. Cardiac hx includes PCI to RCA in 2002. Cath in 2009 showed patent stent and a 50% mLAD lesion. He had a stress test 12/12/2011 that was normal with no ischemia.   Anginal equivalent noted to be flushing sensation.  At previous office visit, had complaints of fatigue felt not to be cardiac related however given known 50% mLAD lesion plan was to update recent testing with exercise stress test however this was never ordered/performed.  On last evaluation, he was doing well from a CV standpoint.  He had no further flushing episodes. He had some mild SOB that he states is essentially unchanged from prior. He continued to smoke 1PPD. Was followed with pulmonary in [place] however is displeased with his care. Clayton Braun and his wife are transferring their care to [facility]. Per chart review, [name] and the patient discussed stress testing given that it had been approximately 7 years since his last evaluation. We discussed the option for Lexiscan testing.   This was performed 03/21/2019 with no ST segment deviation, considered a normal low risk study with no change compared to prior testing 12/12/2011.  Unfortunately today, Clayton Braun states that for approximately one month he has been having increased fatigue and DOE. Activities which were previously easy for him have now become more difficult.  He reports he was walking with his wife one evening and he became so fatigued and SOB that he could not make it  back to the house and she had to come pick him up. Also reports difficulty with completeing tasks like carrying the trash cans or mowing the grass which were easy for him two months prior. He has had no anginal symptoms or flushing which are his typical anginal equivalent symptoms.  Per cath review  from 2009, he does have a 50% mLAD lesion which could be the culprit of his symptoms. He denies orthopnea, LE edema, palpitations, dizziness or syncope.  Past Medical History:  Diagnosis Date  . Abdominal pain   . Acute respiratory disease   . Allergic rhinitis   . Allergies   . Anxiety    09/09/18- not in a long time  . AP (abdominal pain)   . Blood in stool   . Body mass index 27.0-27.9, adult   . Bradycardia, sinus   . Bronchitis   . Candidiasis    ORAL  . Chest pain    March, 2013  . Coronary artery disease    Stent to RCA, 2002   /    cath 2009, stent patent, residual 50% LAD  . Depression    09/09/2018- not in a while  . Diverticulosis 2006   COLONOSCOPY DR. Lindalou Hose, H/O POLYP  . Dizziness    occasionaly  . Dyslipidemia   . Fatigue   . Gastroenteritis and colitis, viral   . GERD (gastroesophageal reflux disease)   . Hypercholesterolemia   . Hyperkalemia  Per the patient, 2013  . Hyperlipidemia   . Hypertension   . Hypertriglyceridemia   . Nummular eczema   . Osteoarthritis   . Pharyngitis   . Pityriasis rosea   . Pneumonia   . Psoriasis   . Rash   . Rhinosinusitis   . Sinusitis   . Skin neoplasm   . Tinea cruris   . Wart     Past Surgical History:  Procedure Laterality Date  . APPENDECTOMY    . CARDIAC CATHETERIZATION     2002  . COLONOSCOPY    . ESOPHAGOGASTRODUODENOSCOPY    . EYE SURGERY     implants, cataracts  . HERNIA REPAIR Right    Inguinal  . LUMBAR LAMINECTOMY/DECOMPRESSION MICRODISCECTOMY Left 03/22/2018   Procedure: Left Lumbar four-five Microdiscectomy;  Surgeon: Erline Levine, MD;  Location: [facility];  Service: Neurosurgery;  Laterality:  Left;  . LUMBAR LAMINECTOMY/DECOMPRESSION MICRODISCECTOMY Left 09/10/2018   Procedure: Redo Left Lumbar four-five Microdiscectomy;  Surgeon: Erline Levine, MD;  Location: [facility];  Service: Neurosurgery;  Laterality: Left;  . NASAL SINUS SURGERY    . PENILE PROSTHESIS PLACEMENT       Current Outpatient Medications  Medication Sig Dispense Refill  . ALPRAZolam (XANAX) 1 MG tablet Take 0.5 mg by mouth at bedtime as needed (sleep.).   2  . aspirin EC 81 MG tablet Take 81 mg by mouth daily.    Marland Kitchen atenolol (TENORMIN) 25 MG tablet Take 12.5 mg by mouth daily.     Marland Kitchen atorvastatin (LIPITOR) 10 MG tablet Take 10 mg by mouth at bedtime.      . FOLBIC 2.5-25-2 MG TABS Take 1 tablet by mouth Daily.    Marland Kitchen gabapentin (NEURONTIN) 300 MG capsule Take 1 capsule (300 mg total) by mouth 3 (three) times daily. 90 capsule 0  . gemfibrozil (LOPID) 600 MG tablet Take 600 mg by mouth 2 (two) times daily.      . montelukast (SINGULAIR) 10 MG tablet Take 10 mg by mouth at bedtime.    . Multiple Vitamin (MULTIVITAMIN WITH MINERALS) TABS tablet Take 1 tablet by mouth daily. Centrum Silver for Men 50+    . nabumetone (RELAFEN) 750 MG tablet Take 750 mg by mouth 2 (two) times daily.  2  . pantoprazole (PROTONIX) 40 MG tablet Take 40 mg by mouth daily.    Marland Kitchen PARoxetine (PAXIL) 20 MG tablet Take 40 mg by mouth at bedtime.     Vladimir Faster Glycol-Propyl Glycol (SYSTANE ULTRA OP) Place 1 drop into both eyes daily as needed (dry eyes).    . ramipril (ALTACE) 2.5 MG capsule Take 2.5 mg by mouth daily.       No current facility-administered medications for this visit.    Allergies:   Niacin and related    Social History:  The patient  reports that he has been smoking cigarettes. He has a 36.00 pack-year smoking history. He has never used smokeless tobacco. He reports previous alcohol use. He reports that he does not use drugs.   Family History:  The patient's family history includes COPD in his father; Heart attack in his  mother; Heart disease in an other family member; Lung disease in an other family member.    ROS:  Please see the history of present illness.   Otherwise, review of systems are positive for none.  All other systems are reviewed and negative.    PHYSICAL EXAM: VS:  BP 116/60   Pulse 62   Ht 5\' 6"  (  1.676 m)   Wt 161 lb 12.8 oz (73.4 kg)   SpO2 98%   BMI 26.12 kg/m  , BMI Body mass index is 26.12 kg/m.   General: Well developed, well nourished, NAD Neck: Negative for carotid bruits. No JVD Lungs:Clear to ausculation bilaterally. No wheezes, rales, or rhonchi. Breathing is unlabored. Cardiovascular: RRR with S1 S2. No murmurs Extremities: No edema. Radial pulses 2+ bilaterally Neuro: Alert and oriented. No focal deficits. No facial asymmetry. MAE spontaneously. Psych: Responds to questions appropriately with normal affect.     EKG:  EKG is not ordered today. The ekg ordered today demonstrates NSR with no acute changes, HR 62 bpm   Recent Labs: No results found for requested labs within last 8760 hours.    Lipid Panel    Component Value Date/Time   CHOL  09/26/2007 0534    117        ATP III CLASSIFICATION:  <200     mg/dL   Desirable  200-239  mg/dL   Borderline High  >=240    mg/dL   High   TRIG 191 (H) 09/26/2007 0534   HDL 24 (L) 09/26/2007 0534   CHOLHDL 4.9 09/26/2007 0534   VLDL 38 09/26/2007 0534   LDLCALC  09/26/2007 0534    55        Total Cholesterol/HDL:CHD Risk Coronary Heart Disease Risk Table                     Men   Women  1/2 Average Risk   3.4   3.3    Wt Readings from Last 3 Encounters:  12/30/19 161 lb 12.8 oz (73.4 kg)  03/21/19 166 lb (75.3 kg)  03/11/19 166 lb 12.8 oz (75.7 kg)     Other studies Reviewed: Additional studies/ records that were reviewed today include:   Catheterization 01/11/2011: Left anterior descending artery: Left anterior descending artery gave rise to 3 diagonal branches, septal perforator. The LAD was small  in caliber and irregular. There was 50% narrowing in the proximal to mid- vessel.  Circumflex artery: The circumflex artery gave rise to a marginal branch and a posterolateral branch. This vessel is also small and irregular but free of major obstruction.  The right coronary artery was a moderate-sized vessel that gave rise to posterior descending branch and 2 posterolateral branches. There is a focal 20% narrowing within the stent in the very proximal portion of the right coronary artery. There are irregularities in the rest of the vessel but no significant obstruction.  The left ventriculogram performed in the RAO projection showed good wall motion with no areas of hypokinesis. The estimated fraction is 60%.  Aortic pressure was 108/65 with a mean of 83 and left ventricle pressure 108/14.  CONCLUSION: Nonobstructive coronary artery disease with 50% narrowing in the proximal LAD, no significant obstruction of circumflex artery, 20% narrowing at the stent in the proximal right coronary and normal LV function.   Stress test from 12/12/2011 with no significant abnormality   Echocardiogram from 11/13/2011>>results in Epic but are scanned and unable to be transferred into current chart   Lexiscan stress test 03/21/2019:   The left ventricular ejection fraction is hyperdynamic (>65%).  Nuclear stress EF: 70%.  There was no ST segment deviation noted during stress.  The study is normal.  This is a low risk study.  No change compared to prior study 12-12-11.    ASSESSMENT AND PLAN:  1. CAD with PCI to RCA 2002: -  Remote hx of PCi to RCA in 2002 patent per cath reports from 2009 with known residual 50% disease in the mLAD region. Stress test from 2013 with no ischemia.  -Plan at last visit was to update ischemic workup given residual disease>> patient has new onset of DOE and fatigue therefore we will proceed with LHC for further evaluation given  the above -Continue ASA, beta blocker, statin   2. HTN: -Well controlled, 116/60 -Continue current regimen   3. HLD: -Labs noted per PCP  4. Anxiety/Depression: -Stable and controlled on Paxil  5. GERD: -On Protonix -Stable, watches triggers   6. Tobacco Use: -Was followed with pulmonary medicine in [place], [place] -Has CT planned for next week for routine evaluation  -Cessation strongly encouraged    Current medicines are reviewed at length with the patient today.  The patient does not have concerns regarding medicines.  The following changes have been made:  no change  Labs/ tests ordered today include: BMET, CBC, COVID, and catheterization No orders of the defined types were placed in this encounter.  Disposition:   FU with [name] or APP in 4 weeks  Signed, Kathyrn Drown, NP  12/30/2019 10:04 AM    [address], [place], [place]  [address] Phone: [phone]; Fax: [phone]

## 2019-12-30 ENCOUNTER — Ambulatory Visit: Payer: Medicare Other | Admitting: Physician Assistant

## 2019-12-30 ENCOUNTER — Other Ambulatory Visit: Payer: Self-pay

## 2019-12-30 ENCOUNTER — Encounter: Payer: Self-pay | Admitting: Cardiology

## 2019-12-30 ENCOUNTER — Ambulatory Visit (INDEPENDENT_AMBULATORY_CARE_PROVIDER_SITE_OTHER): Payer: Medicare Other | Admitting: Cardiology

## 2019-12-30 VITALS — BP 116/60 | HR 62 | Ht 66.0 in | Wt 161.8 lb

## 2019-12-30 DIAGNOSIS — I10 Essential (primary) hypertension: Secondary | ICD-10-CM | POA: Diagnosis not present

## 2019-12-30 DIAGNOSIS — R0602 Shortness of breath: Secondary | ICD-10-CM

## 2019-12-30 DIAGNOSIS — Z720 Tobacco use: Secondary | ICD-10-CM

## 2019-12-30 DIAGNOSIS — R0600 Dyspnea, unspecified: Secondary | ICD-10-CM | POA: Diagnosis not present

## 2019-12-30 DIAGNOSIS — I2510 Atherosclerotic heart disease of native coronary artery without angina pectoris: Secondary | ICD-10-CM

## 2019-12-30 DIAGNOSIS — E785 Hyperlipidemia, unspecified: Secondary | ICD-10-CM | POA: Diagnosis not present

## 2019-12-30 DIAGNOSIS — R0609 Other forms of dyspnea: Secondary | ICD-10-CM

## 2019-12-30 DIAGNOSIS — I2583 Coronary atherosclerosis due to lipid rich plaque: Secondary | ICD-10-CM | POA: Diagnosis not present

## 2019-12-30 LAB — BASIC METABOLIC PANEL
BUN/Creatinine Ratio: 18 (ref 10–24)
BUN: 18 mg/dL (ref 8–27)
CO2: 29 mmol/L (ref 20–29)
Calcium: 10 mg/dL (ref 8.6–10.2)
Chloride: 102 mmol/L (ref 96–106)
Creatinine, Ser: 0.99 mg/dL (ref 0.76–1.27)
GFR calc Af Amer: 89 mL/min/{1.73_m2} (ref >59)
GFR calc non Af Amer: 77 mL/min/{1.73_m2} (ref >59)
Glucose: 112 mg/dL — ABNORMAL HIGH (ref 65–99)
Potassium: 4.1 mmol/L (ref 3.5–5.2)
Sodium: 135 mmol/L (ref 134–144)

## 2019-12-30 LAB — CBC
Hematocrit: 42.9 % (ref 37.5–51.0)
Hemoglobin: 14.5 g/dL (ref 13.0–17.7)
MCH: 32.7 pg (ref 26.6–33.0)
MCHC: 33.8 g/dL (ref 31.5–35.7)
MCV: 97 fL (ref 79–97)
Platelets: 205 10*3/uL (ref 150–450)
RBC: 4.43 x10E6/uL (ref 4.14–5.80)
RDW: 12.8 % (ref 11.6–15.4)
WBC: 6.9 10*3/uL (ref 3.4–10.8)

## 2019-12-30 NOTE — Patient Instructions (Addendum)
Medication Instructions:   Your physician recommends that you continue on your current medications as directed. Please refer to the Current Medication list given to you today.  *If you need a refill on your cardiac medications before your next appointment, please call your pharmacy*  Lab Work:  You will have labs drawn today: BMET/CBC  Testing/Procedures:  Your physician has requested that you have a cardiac catheterization. Cardiac catheterization is used to diagnose and/or treat various heart conditions. Doctors may recommend this procedure for a number of different reasons. The most common reason is to evaluate chest pain. Chest pain can be a symptom of coronary artery disease (CAD), and cardiac catheterization can show whether plaque is narrowing or blocking your heart's arteries. This procedure is also used to evaluate the valves, as well as measure the blood flow and oxygen levels in different parts of your heart. For further information please visit [email or web address]. Please follow instruction sheet, as given.  Your Pre-procedure COVID-19 Testing will be done on  at [place], After your swab you will be given a mask to wear and instructed to go home and quarantine you are not allowed to have visitors until after your procedure. If you test positive you will be notified and your procedure will be cancelled.     Follow-Up: At [facility], you and your health needs are our priority.  As part of our continuing mission to provide you with exceptional heart care, we have created designated Provider Care Teams.  These Care Teams include your primary Cardiologist (physician) and Advanced Practice Providers (APPs -  Physician Assistants and Nurse Practitioners) who all work together to provide you with the care you need, when you need it.  We recommend signing up for the patient portal called "MyChart".  Sign up information is provided on this After Visit Summary.   MyChart is used to connect with patients for Virtual Visits (Telemedicine).  Patients are able to view lab/test results, encounter notes, upcoming appointments, etc.  Non-urgent messages can be sent to your provider as well.   To learn more about what you can do with MyChart, go to [email or web address].    Your next appointment:    On 01/26/20 at 1:45PM with Cecilie Kicks, NP

## 2019-12-31 ENCOUNTER — Telehealth: Payer: Self-pay | Admitting: Cardiovascular Disease

## 2019-12-31 NOTE — Telephone Encounter (Signed)
Called patient back. Informed patient that it would probably be better for him to not wear the patch the morning of his procedure. Patient is aware of lab results.

## 2019-12-31 NOTE — Telephone Encounter (Signed)
Clayton Braun is calling wanting to know if it is okay for him to wear a nicotine patch during his procedure scheduled for Monday 01/05/20. Please advise.

## 2020-01-02 ENCOUNTER — Other Ambulatory Visit (HOSPITAL_COMMUNITY): Payer: Medicare Other

## 2020-01-02 ENCOUNTER — Other Ambulatory Visit (HOSPITAL_COMMUNITY)
Admission: RE | Admit: 2020-01-02 | Discharge: 2020-01-02 | Disposition: A | Discharge status: Home / Self Care | Payer: Medicare Other | Source: Ambulatory Visit | Attending: Cardiovascular Disease | Admitting: Cardiovascular Disease

## 2020-01-02 DIAGNOSIS — Z20822 Contact with and (suspected) exposure to covid-19: Secondary | ICD-10-CM | POA: Diagnosis not present

## 2020-01-02 DIAGNOSIS — Z01812 Encounter for preprocedural laboratory examination: Secondary | ICD-10-CM | POA: Diagnosis not present

## 2020-01-02 LAB — SARS CORONAVIRUS 2 (TAT 6-24 HRS): SARS Coronavirus 2: NEGATIVE

## 2020-01-05 ENCOUNTER — Encounter (HOSPITAL_COMMUNITY)
Admission: RE | Disposition: A | Discharge status: Home / Self Care | Payer: Self-pay | Source: Home / Self Care | Attending: Cardiovascular Disease

## 2020-01-05 ENCOUNTER — Inpatient Hospital Stay: Admission: RE | Admit: 2020-01-05 | Payer: Medicare Other | Source: Ambulatory Visit

## 2020-01-05 ENCOUNTER — Ambulatory Visit (HOSPITAL_COMMUNITY)
Admission: RE | Admit: 2020-01-05 | Discharge: 2020-01-05 | Disposition: A | Discharge status: Home / Self Care | Payer: Medicare Other | Attending: Cardiovascular Disease | Admitting: Cardiovascular Disease

## 2020-01-05 ENCOUNTER — Other Ambulatory Visit: Payer: Self-pay

## 2020-01-05 DIAGNOSIS — K219 Gastro-esophageal reflux disease without esophagitis: Secondary | ICD-10-CM | POA: Insufficient documentation

## 2020-01-05 DIAGNOSIS — Z955 Presence of coronary angioplasty implant and graft: Secondary | ICD-10-CM | POA: Insufficient documentation

## 2020-01-05 DIAGNOSIS — E7800 Pure hypercholesterolemia, unspecified: Secondary | ICD-10-CM | POA: Insufficient documentation

## 2020-01-05 DIAGNOSIS — E781 Pure hyperglyceridemia: Secondary | ICD-10-CM | POA: Diagnosis not present

## 2020-01-05 DIAGNOSIS — I2510 Atherosclerotic heart disease of native coronary artery without angina pectoris: Secondary | ICD-10-CM | POA: Diagnosis not present

## 2020-01-05 DIAGNOSIS — I25119 Atherosclerotic heart disease of native coronary artery with unspecified angina pectoris: Secondary | ICD-10-CM | POA: Insufficient documentation

## 2020-01-05 DIAGNOSIS — E785 Hyperlipidemia, unspecified: Secondary | ICD-10-CM | POA: Diagnosis not present

## 2020-01-05 DIAGNOSIS — R0609 Other forms of dyspnea: Secondary | ICD-10-CM | POA: Diagnosis not present

## 2020-01-05 DIAGNOSIS — I10 Essential (primary) hypertension: Secondary | ICD-10-CM | POA: Diagnosis not present

## 2020-01-05 DIAGNOSIS — F419 Anxiety disorder, unspecified: Secondary | ICD-10-CM | POA: Insufficient documentation

## 2020-01-05 DIAGNOSIS — F329 Major depressive disorder, single episode, unspecified: Secondary | ICD-10-CM | POA: Diagnosis not present

## 2020-01-05 DIAGNOSIS — F17210 Nicotine dependence, cigarettes, uncomplicated: Secondary | ICD-10-CM | POA: Insufficient documentation

## 2020-01-05 HISTORY — PX: LEFT HEART CATH AND CORONARY ANGIOGRAPHY: CATH118249

## 2020-01-05 SURGERY — LEFT HEART CATH AND CORONARY ANGIOGRAPHY
Anesthesia: LOCAL

## 2020-01-05 MED ORDER — LIDOCAINE HCL (PF) 1 % IJ SOLN
INTRAMUSCULAR | Status: DC | PRN
  Administered 2020-01-05: 2 mL

## 2020-01-05 MED ORDER — HEPARIN SODIUM (PORCINE) 1000 UNIT/ML IJ SOLN
INTRAMUSCULAR | Status: DC | PRN
  Administered 2020-01-05: 4000 [IU] via INTRAVENOUS

## 2020-01-05 MED ORDER — VERAPAMIL HCL 2.5 MG/ML IV SOLN
INTRAVENOUS | Status: AC
  Filled 2020-01-05: qty 2

## 2020-01-05 MED ORDER — SODIUM CHLORIDE 0.9 % WEIGHT BASED INFUSION
3.0000 mL/kg/h | INTRAVENOUS | Status: AC
  Administered 2020-01-05: 10:00:00 3 mL/kg/h via INTRAVENOUS

## 2020-01-05 MED ORDER — SODIUM CHLORIDE 0.9% FLUSH: 3.0000 mL | Freq: Two times a day (BID) | INTRAVENOUS | Status: DC

## 2020-01-05 MED ORDER — LABETALOL HCL 5 MG/ML IV SOLN: 10.0000 mg | INTRAVENOUS | Status: DC | PRN

## 2020-01-05 MED ORDER — SODIUM CHLORIDE 0.9 % WEIGHT BASED INFUSION: 1.0000 mL/kg/h | INTRAVENOUS | Status: DC

## 2020-01-05 MED ORDER — SODIUM CHLORIDE 0.9 % IV SOLN: INTRAVENOUS | Status: AC

## 2020-01-05 MED ORDER — HEPARIN (PORCINE) IN NACL 1000-0.9 UT/500ML-% IV SOLN
INTRAVENOUS | Status: DC | PRN
  Administered 2020-01-05 (×2): 500 mL

## 2020-01-05 MED ORDER — LIDOCAINE HCL (PF) 1 % IJ SOLN
INTRAMUSCULAR | Status: AC
  Filled 2020-01-05: qty 30

## 2020-01-05 MED ORDER — MIDAZOLAM HCL 2 MG/2ML IJ SOLN
INTRAMUSCULAR | Status: AC
  Filled 2020-01-05: qty 2

## 2020-01-05 MED ORDER — FENTANYL CITRATE (PF) 100 MCG/2ML IJ SOLN
INTRAMUSCULAR | Status: DC | PRN
  Administered 2020-01-05: 25 ug via INTRAVENOUS

## 2020-01-05 MED ORDER — ASPIRIN 81 MG PO CHEW: 81.0000 mg | CHEWABLE_TABLET | ORAL | Status: DC

## 2020-01-05 MED ORDER — SODIUM CHLORIDE 0.9% FLUSH: 3.0000 mL | INTRAVENOUS | Status: DC | PRN

## 2020-01-05 MED ORDER — HYDRALAZINE HCL 20 MG/ML IJ SOLN: 10.0000 mg | INTRAMUSCULAR | Status: DC | PRN

## 2020-01-05 MED ORDER — ACETAMINOPHEN 325 MG PO TABS: 650.0000 mg | ORAL_TABLET | ORAL | Status: DC | PRN

## 2020-01-05 MED ORDER — VERAPAMIL HCL 2.5 MG/ML IV SOLN: INTRAVENOUS | Status: DC | PRN

## 2020-01-05 MED ORDER — HEPARIN (PORCINE) IN NACL 1000-0.9 UT/500ML-% IV SOLN
INTRAVENOUS | Status: AC
  Filled 2020-01-05: qty 1000

## 2020-01-05 MED ORDER — MIDAZOLAM HCL 2 MG/2ML IJ SOLN
INTRAMUSCULAR | Status: DC | PRN
  Administered 2020-01-05: 2 mg via INTRAVENOUS

## 2020-01-05 MED ORDER — IOHEXOL 350 MG/ML SOLN
INTRAVENOUS | Status: DC | PRN
  Administered 2020-01-05: 70 mL

## 2020-01-05 MED ORDER — ONDANSETRON HCL 4 MG/2ML IJ SOLN: 4.0000 mg | Freq: Four times a day (QID) | INTRAMUSCULAR | Status: DC | PRN

## 2020-01-05 MED ORDER — HEPARIN SODIUM (PORCINE) 1000 UNIT/ML IJ SOLN
INTRAMUSCULAR | Status: AC
  Filled 2020-01-05: qty 1

## 2020-01-05 MED ORDER — SODIUM CHLORIDE 0.9 % IV SOLN: 250.0000 mL | INTRAVENOUS | Status: DC | PRN

## 2020-01-05 MED ORDER — FENTANYL CITRATE (PF) 100 MCG/2ML IJ SOLN
INTRAMUSCULAR | Status: AC
  Filled 2020-01-05: qty 2

## 2020-01-05 SURGICAL SUPPLY — 12 items
CATH 5FR JL3.5 JR4 ANG PIG MP (CATHETERS) ×2 IMPLANT
CATH INFINITI 5FR AL1 (CATHETERS) ×2 IMPLANT
CATH VISTA GUIDE 6FR XBLAD3.5 (CATHETERS) ×2 IMPLANT
DEVICE RAD COMP TR BAND LRG (VASCULAR PRODUCTS) ×2 IMPLANT
GLIDESHEATH SLEND SS 6F .021 (SHEATH) ×2 IMPLANT
GUIDEWIRE INQWIRE 1.5J.035X260 (WIRE) ×1 IMPLANT
INQWIRE 1.5J .035X260CM (WIRE) ×2
KIT HEART LEFT (KITS) ×2 IMPLANT
PACK CARDIAC CATHETERIZATION (CUSTOM PROCEDURE TRAY) ×2 IMPLANT
SYR MEDRAD MARK 7 150ML (SYRINGE) ×2 IMPLANT
TRANSDUCER W/STOPCOCK (MISCELLANEOUS) ×2 IMPLANT
TUBING CIL FLEX 10 FLL-RA (TUBING) ×2 IMPLANT

## 2020-01-05 NOTE — Interval H&P Note (Signed)
History and Physical Interval Note:  01/05/2020 11:28 AM  Clayton Braun  has presented today for surgery, with the diagnosis of Shortness of Breath.  The various methods of treatment have been discussed with the patient and family. After consideration of risks, benefits and other options for treatment, the patient has consented to  Procedure(s): LEFT HEART CATH AND CORONARY ANGIOGRAPHY (N/A) as a surgical intervention.  The patient's history has been reviewed, patient examined, no change in status, stable for surgery.  I have reviewed the patient's chart and labs.  Questions were answered to the patient's satisfaction.    Cath Lab Visit (complete for each Cath Lab visit)  Clinical Evaluation Leading to the Procedure:   ACS: Yes.    Non-ACS:    Anginal Classification: CCS III  Anti-ischemic medical therapy: Minimal Therapy (1 class of medications)  Non-Invasive Test Results: No non-invasive testing performed  Prior CABG: No previous CABG        Lauree Chandler

## 2020-01-05 NOTE — Discharge Instructions (Signed)
DRINK PLENTY OF FLUIDS FOR THE NEXT 2-3 DAYS.  KEEP ARM ELEVATED THE REMAINDER OF THE DAY.  Radial Site Care  This sheet gives you information about how to care for yourself after your procedure. Your health care provider may also give you more specific instructions. If you have problems or questions, contact your health care provider. What can I expect after the procedure? After the procedure, it is common to have:  Bruising and tenderness at the catheter insertion area. Follow these instructions at home: Medicines  Take over-the-counter and prescription medicines only as told by your health care provider. Insertion site care 1. Follow instructions from your health care provider about how to take care of your insertion site. Make sure you: ? Wash your hands with soap and water before you change your bandage (dressing). If soap and water are not available, use hand sanitizer. ? Change your dressing as told by your health care provider. 2. Check your insertion site every day for signs of infection. Check for: ? Redness, swelling, or pain. ? Fluid or blood. ? Pus or a bad smell. ? Warmth. 3. Do not take baths, swim, or use a hot tub for 5 days. 4. You may shower 24-48 hours after the procedure. ? Remove the dressing and gently wash the site with plain soap and water. ? Pat the area dry with a clean towel. ? Do not rub the site. That could cause bleeding. 5. Do not apply powder or lotion to the site. Activity  1. For 24 hours after the procedure, or as directed by your health care provider: ? Do not flex or bend the affected arm. ? Do not push or pull heavy objects with the affected arm. ? Do not drive yourself home from the hospital or clinic. You may drive 24 hours after the procedure. ? Do not operate machinery or power tools. 2. Do not push, pull or lift anything that is heavier than 10 lb for 5 days. 3. Ask your health care provider when it is okay to: ? Return to work or  school. ? Resume usual physical activities or sports. ? Resume sexual activity. General instructions  If the catheter site starts to bleed, raise your arm and put firm pressure on the site. If the bleeding does not stop, get help right away. This is a medical emergency.  If you went home on the same day as your procedure, a responsible adult should be with you for the first 24 hours after you arrive home.  Keep all follow-up visits as told by your health care provider. This is important. Contact a health care provider if:  You have a fever.  You have redness, swelling, or yellow drainage around your insertion site. Get help right away if:  You have unusual pain at the radial site.  The catheter insertion area swells very fast.  The insertion area is bleeding, and the bleeding does not stop when you hold steady pressure on the area.  Your arm or hand becomes pale, cool, tingly, or numb. These symptoms may represent a serious problem that is an emergency. Do not wait to see if the symptoms will go away. Get medical help right away. Call your local emergency services (911 in the [place] Do not drive yourself to the hospital. Summary  After the procedure, it is common to have bruising and tenderness at the site.  Follow instructions from your health care provider about how to take care of your radial site wound. Check   the wound every day for signs of infection.  Do not push, pull or lift anything that is heavier than 10 lb for 5 days.  This information is not intended to replace advice given to you by your health care provider. Make sure you discuss any questions you have with your health care provider. Document Revised: 10/03/2017 Document Reviewed: 10/03/2017 Elsevier Patient Education  2020 [employer] 

## 2020-01-14 DIAGNOSIS — R062 Wheezing: Secondary | ICD-10-CM | POA: Diagnosis not present

## 2020-01-14 DIAGNOSIS — F331 Major depressive disorder, recurrent, moderate: Secondary | ICD-10-CM | POA: Diagnosis not present

## 2020-01-14 DIAGNOSIS — R2681 Unsteadiness on feet: Secondary | ICD-10-CM | POA: Diagnosis not present

## 2020-01-14 DIAGNOSIS — R002 Palpitations: Secondary | ICD-10-CM | POA: Diagnosis not present

## 2020-01-14 DIAGNOSIS — F17200 Nicotine dependence, unspecified, uncomplicated: Secondary | ICD-10-CM | POA: Diagnosis not present

## 2020-01-14 DIAGNOSIS — R0609 Other forms of dyspnea: Secondary | ICD-10-CM | POA: Diagnosis not present

## 2020-01-15 ENCOUNTER — Other Ambulatory Visit (HOSPITAL_COMMUNITY): Payer: Self-pay | Admitting: Respiratory Therapy

## 2020-01-20 ENCOUNTER — Ambulatory Visit
Admission: RE | Admit: 2020-01-20 | Discharge: 2020-01-20 | Disposition: A | Discharge status: Home / Self Care | Payer: Medicare Other | Source: Ambulatory Visit | Attending: Internal Medicine | Admitting: Internal Medicine

## 2020-01-20 DIAGNOSIS — R911 Solitary pulmonary nodule: Secondary | ICD-10-CM

## 2020-01-20 DIAGNOSIS — R918 Other nonspecific abnormal finding of lung field: Secondary | ICD-10-CM | POA: Diagnosis not present

## 2020-01-21 ENCOUNTER — Encounter: Payer: Self-pay | Admitting: Internal Medicine

## 2020-01-21 NOTE — Progress Notes (Signed)
Sambhav, As per the recommendation, we will continue to monitor yearly.  Let me know if you have any questions, Dr. Francesco Sor

## 2020-01-22 DIAGNOSIS — Z23 Encounter for immunization: Secondary | ICD-10-CM | POA: Diagnosis not present

## 2020-01-26 ENCOUNTER — Other Ambulatory Visit: Payer: Self-pay

## 2020-01-26 ENCOUNTER — Encounter: Payer: Self-pay | Admitting: Cardiology

## 2020-01-26 ENCOUNTER — Ambulatory Visit (INDEPENDENT_AMBULATORY_CARE_PROVIDER_SITE_OTHER): Payer: Medicare Other | Admitting: Cardiology

## 2020-01-26 VITALS — BP 104/56 | HR 56 | Ht 66.0 in | Wt 162.0 lb

## 2020-01-26 DIAGNOSIS — D485 Neoplasm of uncertain behavior of skin: Secondary | ICD-10-CM | POA: Diagnosis not present

## 2020-01-26 DIAGNOSIS — I2510 Atherosclerotic heart disease of native coronary artery without angina pectoris: Secondary | ICD-10-CM | POA: Diagnosis not present

## 2020-01-26 DIAGNOSIS — L570 Actinic keratosis: Secondary | ICD-10-CM | POA: Diagnosis not present

## 2020-01-26 DIAGNOSIS — L578 Other skin changes due to chronic exposure to nonionizing radiation: Secondary | ICD-10-CM | POA: Diagnosis not present

## 2020-01-26 DIAGNOSIS — L814 Other melanin hyperpigmentation: Secondary | ICD-10-CM | POA: Diagnosis not present

## 2020-01-26 DIAGNOSIS — E785 Hyperlipidemia, unspecified: Secondary | ICD-10-CM

## 2020-01-26 DIAGNOSIS — I2583 Coronary atherosclerosis due to lipid rich plaque: Secondary | ICD-10-CM

## 2020-01-26 DIAGNOSIS — D1801 Hemangioma of skin and subcutaneous tissue: Secondary | ICD-10-CM | POA: Diagnosis not present

## 2020-01-26 DIAGNOSIS — Z720 Tobacco use: Secondary | ICD-10-CM

## 2020-01-26 DIAGNOSIS — I10 Essential (primary) hypertension: Secondary | ICD-10-CM

## 2020-01-26 DIAGNOSIS — L409 Psoriasis, unspecified: Secondary | ICD-10-CM | POA: Diagnosis not present

## 2020-01-26 DIAGNOSIS — L821 Other seborrheic keratosis: Secondary | ICD-10-CM | POA: Diagnosis not present

## 2020-01-26 NOTE — Progress Notes (Signed)
Cardiology Office Note   Date:  01/26/2020   ID:  Clayton Braun, DOB Jan 12, 1951, MRN [id number]  PCP:  Sueanne Margarita, DO  Cardiologist:  [name]    Chief Complaint  Patient presents with  . Hospitalization Follow-up      History of Present Illness: Clayton Braun is a 69 y.o. male who presents for post hospitalization.   He was last seen by [name] for new patient consultation 01/17/2018. Prior to that he had not been seen by Cardiology since 2013. He was previously followed by Dr. Ron Parker. Cardiac hx includes PCI to RCA in 2002. Cath in 2009 showed patent stent and a 50% mLAD lesion. He had a stress test 12/12/2011 that was normal with no ischemia.  Anginal equivalent noted to be flushing sensation.  At previous office visit, had complaints of fatigue felt not to be cardiac related however given known 50% mLAD lesion plan was to update recent testing with exercise stress test however this was never ordered/performed.  On last evaluation, he was doing well from a CV standpoint.  He had no further flushing episodes. He had some mild SOB that he states is essentially unchanged from prior. He continued to smoke 1PPD. Was followed with pulmonary in [place] however is displeased with his care. Clayton Braun and his wife are transferring their care to [facility]. Per chart review, [name] and the patient discussed stress testing given that it had been approximately 7 years since his last evaluation. We discussed the option for Lexiscan testing.   This was performed 03/21/2019 with no ST segment deviation, considered a normal low risk study with no change compared to prior testing 12/12/2011.  Unfortunately today, Clayton Braun states that for approximately one month he has been having increased fatigue and DOE. Activities which were previously easy for him have now become more difficult.  He reports he was walking with his wife one evening and he became so fatigued and SOB that he could not  make it back to the house and she had to come pick him up. Also reports difficulty with completeing tasks like carrying the trash cans or mowing the grass which were easy for him two months prior. He has had no anginal symptoms or flushing which are his typical anginal equivalent symptoms.  Per cath review  from 2009, he does have a 50% mLAD lesion which could be the culprit of his symptoms. He denies orthopnea, LE edema, palpitations, dizziness or syncope.  Cath with non obstructive disease.  EF 55-65%, previous stent with minimal in stent restenosis.    Today back for follow up post cath continue medical management.  His SOB has resolved, he is thinking the pollen had something to do with it.  We reviewed cath results. His PCP follows lipids.  Questions answered.     Past Medical History:  Diagnosis Date  . Abdominal pain   . Acute respiratory disease   . Allergic rhinitis   . Allergies   . Anxiety    09/09/18- not in a long time  . AP (abdominal pain)   . Blood in stool   . Body mass index 27.0-27.9, adult   . Bradycardia, sinus   . Bronchitis   . Candidiasis    ORAL  . Chest pain    March, 2013  . Coronary artery disease    Stent to RCA, 2002   /    cath 2009, stent patent, residual 50% LAD  . Depression  09/09/2018- not in a while  . Diverticulosis 2006   COLONOSCOPY DR. Lindalou Hose, H/O POLYP  . Dizziness    occasionaly  . Dyslipidemia   . Fatigue   . Gastroenteritis and colitis, viral   . GERD (gastroesophageal reflux disease)   . Hypercholesterolemia   . Hyperkalemia    Per the patient, 2013  . Hyperlipidemia   . Hypertension   . Hypertriglyceridemia   . Nummular eczema   . Osteoarthritis   . Pharyngitis   . Pityriasis rosea   . Pneumonia   . Psoriasis   . Rash   . Rhinosinusitis   . Sinusitis   . Skin neoplasm   . Tinea cruris   . Wart     Past Surgical History:  Procedure Laterality Date  . APPENDECTOMY    . CARDIAC CATHETERIZATION     2002  .  COLONOSCOPY    . ESOPHAGOGASTRODUODENOSCOPY    . EYE SURGERY     implants, cataracts  . HERNIA REPAIR Right    Inguinal  . LEFT HEART CATH AND CORONARY ANGIOGRAPHY N/A 01/05/2020   Procedure: LEFT HEART CATH AND CORONARY ANGIOGRAPHY;  Surgeon: Burnell Blanks, MD;  Location: [facility];  Service: Cardiovascular;  Laterality: N/A;  . LUMBAR LAMINECTOMY/DECOMPRESSION MICRODISCECTOMY Left 03/22/2018   Procedure: Left Lumbar four-five Microdiscectomy;  Surgeon: Erline Levine, MD;  Location: [facility];  Service: Neurosurgery;  Laterality: Left;  . LUMBAR LAMINECTOMY/DECOMPRESSION MICRODISCECTOMY Left 09/10/2018   Procedure: Redo Left Lumbar four-five Microdiscectomy;  Surgeon: Erline Levine, MD;  Location: [facility];  Service: Neurosurgery;  Laterality: Left;  . NASAL SINUS SURGERY    . PENILE PROSTHESIS PLACEMENT       Current Outpatient Medications  Medication Sig Dispense Refill  . ALPRAZolam (XANAX) 1 MG tablet Take 1 mg by mouth at bedtime.   2  . aspirin EC 81 MG tablet Take 81 mg by mouth daily.    Marland Kitchen atenolol (TENORMIN) 25 MG tablet Take 25 mg by mouth daily.     Marland Kitchen atorvastatin (LIPITOR) 10 MG tablet Take 10 mg by mouth at bedtime.      . FOLBIC 2.5-25-2 MG TABS Take 1 tablet by mouth Daily.    Marland Kitchen gemfibrozil (LOPID) 600 MG tablet Take 600 mg by mouth 2 (two) times daily.      . montelukast (SINGULAIR) 10 MG tablet Take 10 mg by mouth at bedtime.    . Multiple Vitamin (MULTIVITAMIN WITH MINERALS) TABS tablet Take 1 tablet by mouth daily. Centrum Silver for Men 50+    . pantoprazole (PROTONIX) 40 MG tablet Take 40 mg by mouth at bedtime.     Marland Kitchen PARoxetine (PAXIL) 40 MG tablet Take 40 mg by mouth at bedtime.     Vladimir Faster Glycol-Propyl Glycol (SYSTANE ULTRA OP) Place 1 drop into both eyes daily as needed (dry eyes).    . ramipril (ALTACE) 2.5 MG capsule Take 2.5 mg by mouth daily.       No current facility-administered medications for this visit.    Allergies:   Niacin and  related    Social History:  The patient  reports that he has been smoking cigarettes. He has a 36.00 pack-year smoking history. He has never used smokeless tobacco. He reports previous alcohol use. He reports that he does not use drugs.   Family History:  The patient's family history includes COPD in his father; Heart attack in his mother; Heart disease in an other family member; Lung disease in an  other family member.    ROS:  General:no colds or fevers, no weight changes Skin:no rashes or ulcers HEENT:no blurred vision, no congestion CV:see HPI PUL:see HPI GI:no diarrhea constipation or melena, no indigestion GU:no hematuria, no dysuria MS:no joint pain, no claudication Neuro:no syncope, no lightheadedness Endo:no diabetes, no thyroid disease  Wt Readings from Last 3 Encounters:  01/26/20 162 lb (73.5 kg)  01/05/20 162 lb (73.5 kg)  12/30/19 161 lb 12.8 oz (73.4 kg)     PHYSICAL EXAM: VS:  BP (!) 104/56   Pulse (!) 56   Ht 5\' 6"  (1.676 m)   Wt 162 lb (73.5 kg)   SpO2 97%   BMI 26.15 kg/m  , BMI Body mass index is 26.15 kg/m. General:Pleasant affect, NAD Skin:Warm and dry, brisk capillary refill HEENT:normocephalic, sclera clear, mucus membranes moist Heart:S1S2 RRR without murmur, gallup, rub or click, Rt wrist cath site without hematoma and has healed.  Lungs:clear without rales, rhonchi, or wheezes [id number], non tender, + BS, do not palpate liver spleen or masses Ext:no lower ext edema, 2+ pedal pulses, 2+ radial pulses Neuro:alert and oriented X 3, MAE, follows commands, + facial symmetry    EKG:  EKG is NOT ordered today.   Recent Labs: 12/30/2019: BUN 18; Creatinine, Ser 0.99; Hemoglobin 14.5; Platelets 205; Potassium 4.1; Sodium 135    Lipid Panel    Component Value Date/Time   CHOL  09/26/2007 0534    117        ATP III CLASSIFICATION:  <200     mg/dL   Desirable  200-239  mg/dL   Borderline High  >=240    mg/dL   High   TRIG 191 (H) 09/26/2007  0534   HDL 24 (L) 09/26/2007 0534   CHOLHDL 4.9 09/26/2007 0534   VLDL 38 09/26/2007 0534   LDLCALC  09/26/2007 0534    55        Total Cholesterol/HDL:CHD Risk Coronary Heart Disease Risk Table                     Men   Women  1/2 Average Risk   3.4   3.3       Other studies Reviewed: Additional studies/ records that were reviewed today include: . Cardiac cath 01/05/20  Prox RCA-1 lesion is 30% stenosed.  Prox RCA-2 lesion is 30% stenosed.  Prox LAD to Mid LAD lesion is 40% stenosed.  The left ventricular systolic function is normal.  LV end diastolic pressure is normal.  The left ventricular ejection fraction is 55-65% by visual estimate.  There is no mitral valve regurgitation.   1. Mild non-obstructive disease in the mid LAD 2. Patent stent proximal RCA with minimal in stent restenosis.  3. Normal LV systolic function 4. Normal LVEDP  Recommendations: Continue medical management of CAD  ASSESSMENT AND PLAN:  1.  CAD with hx of stent to RCA with 30% in stent restenosis.  Otherwise cath results with non obstructive disease.  His symptoms have resolved.  Continue ASA BB and ACE.  2.  HLD on statin and followed by PCP and were at goal in 05/2019  3.   HTN controlled to low if dizzy or lightheaded would hold ACE only on 2.5 mg and has normal EF.   4. Tobacco use, is aware he needs to stop.   Current medicines are reviewed with the patient today.  The patient Has no concerns regarding medicines.  The following changes have been made:  See above Labs/ tests ordered today include:see above  Disposition:   FU:  see above  Signed, Cecilie Kicks, NP  01/26/2020 2:16 PM    [facility] [place], [place], [place], [place] Phone: [phone]; Fax: [phone]

## 2020-01-26 NOTE — Patient Instructions (Signed)
Medication Instructions:   Your physician recommends that you continue on your current medications as directed. Please refer to the Current Medication list given to you today.  *If you need a refill on your cardiac medications before your next appointment, please call your pharmacy*  Lab Work:  None ordered today  Testing/Procedures:  None ordered today  Follow-Up: At [facility], you and your health needs are our priority.  As part of our continuing mission to provide you with exceptional heart care, we have created designated Provider Care Teams.  These Care Teams include your primary Cardiologist (physician) and Advanced Practice Providers (APPs -  Physician Assistants and Nurse Practitioners) who all work together to provide you with the care you need, when you need it.  We recommend signing up for the patient portal called "MyChart".  Sign up information is provided on this After Visit Summary.  MyChart is used to connect with patients for Virtual Visits (Telemedicine).  Patients are able to view lab/test results, encounter notes, upcoming appointments, etc.  Non-urgent messages can be sent to your provider as well.   To learn more about what you can do with MyChart, go to [email or web address].    Your next appointment:   6 month(s)  The format for your next appointment:   In Person  Provider:   Jenkins Rouge, MD

## 2020-02-05 ENCOUNTER — Encounter (HOSPITAL_COMMUNITY): Payer: Medicare Other

## 2020-02-10 DIAGNOSIS — L570 Actinic keratosis: Secondary | ICD-10-CM | POA: Diagnosis not present

## 2020-02-10 DIAGNOSIS — L409 Psoriasis, unspecified: Secondary | ICD-10-CM | POA: Diagnosis not present

## 2020-03-23 DIAGNOSIS — I10 Essential (primary) hypertension: Secondary | ICD-10-CM | POA: Diagnosis not present

## 2020-03-23 DIAGNOSIS — Z91030 Bee allergy status: Secondary | ICD-10-CM | POA: Diagnosis not present

## 2020-03-23 DIAGNOSIS — Z888 Allergy status to other drugs, medicaments and biological substances status: Secondary | ICD-10-CM | POA: Diagnosis not present

## 2020-03-23 DIAGNOSIS — T782XXA Anaphylactic shock, unspecified, initial encounter: Secondary | ICD-10-CM | POA: Diagnosis not present

## 2020-03-23 DIAGNOSIS — E7800 Pure hypercholesterolemia, unspecified: Secondary | ICD-10-CM | POA: Diagnosis not present

## 2020-03-23 DIAGNOSIS — Z87892 Personal history of anaphylaxis: Secondary | ICD-10-CM | POA: Diagnosis not present

## 2020-03-25 DIAGNOSIS — D485 Neoplasm of uncertain behavior of skin: Secondary | ICD-10-CM | POA: Diagnosis not present

## 2020-04-08 DIAGNOSIS — H61001 Unspecified perichondritis of right external ear: Secondary | ICD-10-CM | POA: Diagnosis not present

## 2020-04-08 DIAGNOSIS — L570 Actinic keratosis: Secondary | ICD-10-CM | POA: Diagnosis not present

## 2020-04-13 DIAGNOSIS — H61001 Unspecified perichondritis of right external ear: Secondary | ICD-10-CM | POA: Insufficient documentation

## 2020-05-18 DIAGNOSIS — H61001 Unspecified perichondritis of right external ear: Secondary | ICD-10-CM | POA: Diagnosis not present

## 2020-05-20 DIAGNOSIS — E785 Hyperlipidemia, unspecified: Secondary | ICD-10-CM | POA: Diagnosis not present

## 2020-05-20 DIAGNOSIS — Z125 Encounter for screening for malignant neoplasm of prostate: Secondary | ICD-10-CM | POA: Diagnosis not present

## 2020-05-27 DIAGNOSIS — H8110 Benign paroxysmal vertigo, unspecified ear: Secondary | ICD-10-CM | POA: Diagnosis not present

## 2020-05-27 DIAGNOSIS — I10 Essential (primary) hypertension: Secondary | ICD-10-CM | POA: Diagnosis not present

## 2020-05-27 DIAGNOSIS — Z0000 Encounter for general adult medical examination without abnormal findings: Secondary | ICD-10-CM | POA: Diagnosis not present

## 2020-05-27 DIAGNOSIS — T63444D Toxic effect of venom of bees, undetermined, subsequent encounter: Secondary | ICD-10-CM | POA: Diagnosis not present

## 2020-05-27 DIAGNOSIS — F329 Major depressive disorder, single episode, unspecified: Secondary | ICD-10-CM | POA: Diagnosis not present

## 2020-05-27 DIAGNOSIS — R202 Paresthesia of skin: Secondary | ICD-10-CM | POA: Diagnosis not present

## 2020-05-27 DIAGNOSIS — I700 Atherosclerosis of aorta: Secondary | ICD-10-CM | POA: Diagnosis not present

## 2020-05-27 DIAGNOSIS — F17200 Nicotine dependence, unspecified, uncomplicated: Secondary | ICD-10-CM | POA: Diagnosis not present

## 2020-05-27 DIAGNOSIS — I2510 Atherosclerotic heart disease of native coronary artery without angina pectoris: Secondary | ICD-10-CM | POA: Diagnosis not present

## 2020-05-27 DIAGNOSIS — R0609 Other forms of dyspnea: Secondary | ICD-10-CM | POA: Diagnosis not present

## 2020-05-27 DIAGNOSIS — E785 Hyperlipidemia, unspecified: Secondary | ICD-10-CM | POA: Diagnosis not present

## 2020-05-27 DIAGNOSIS — R82998 Other abnormal findings in urine: Secondary | ICD-10-CM | POA: Diagnosis not present

## 2020-05-27 DIAGNOSIS — J302 Other seasonal allergic rhinitis: Secondary | ICD-10-CM | POA: Diagnosis not present

## 2020-05-27 DIAGNOSIS — R413 Other amnesia: Secondary | ICD-10-CM | POA: Diagnosis not present

## 2020-06-17 DIAGNOSIS — H61031 Chondritis of right external ear: Secondary | ICD-10-CM | POA: Diagnosis not present

## 2020-06-17 DIAGNOSIS — H61021 Chronic perichondritis of right external ear: Secondary | ICD-10-CM | POA: Diagnosis not present

## 2020-07-05 IMAGING — MR MR LUMBAR SPINE WO/W CM
4 of 7 series · 26 of 48 positions shown · IV contrast (gadavist)
Comparison: Radiographs dated 03/22/2018 and lumbar MRI dated
02/02/2018

CLINICAL DATA: Left hip pain and left leg pain. Previous lumbar
surgery on 03/22/2018. Progressive left lower back pain.

EXAM:
MRI LUMBAR SPINE WITHOUT AND WITH CONTRAST
TECHNIQUE: Multiplanar and multiecho pulse sequences of the lumbar spine were
obtained without and with intravenous contrast.
CONTRAST:  7 cc Gadavist.

[Series 5: T2 · sagittal · 4.0mm · 0.73mm/px · 6 of 17 slices shown (1 of 2)]
[im 1/17]
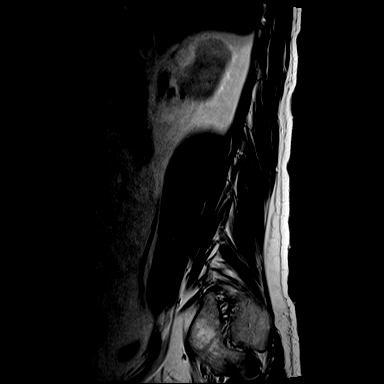
[im 4/17]
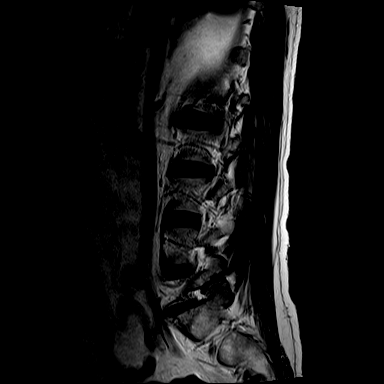
[im 7/17]
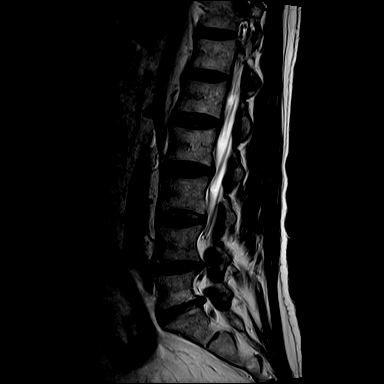
[im 10/17]
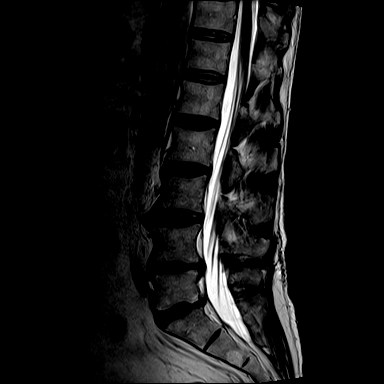
[im 13/17]
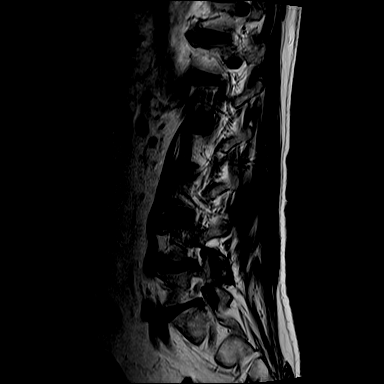
[im 17/17]
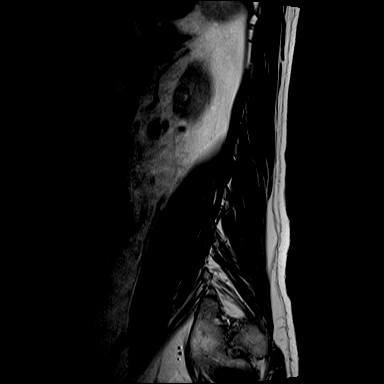

[Series 7: T1 · sagittal · 4.0mm · 0.88mm/px · 5 of 17 slices shown (1 of 2)]
[im 1/17]
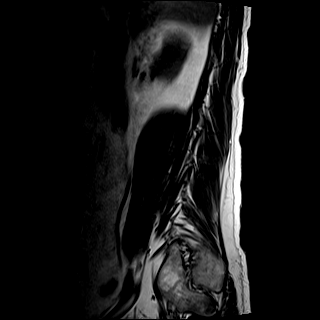
[im 5/17]
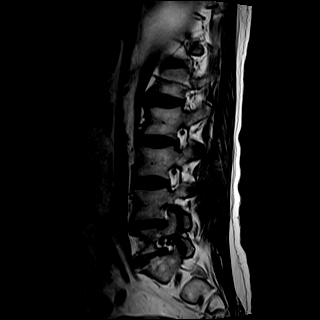
[im 9/17]
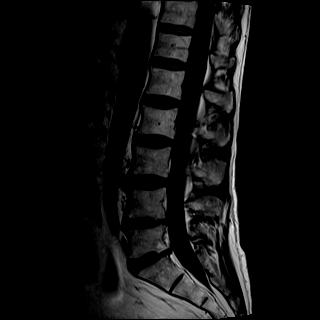
[im 13/17]
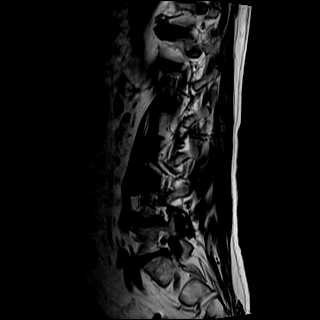
[im 17/17]
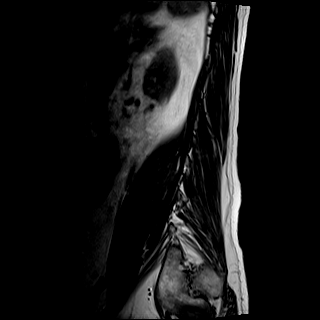

[Series 8: T2 · axial · 4.0mm · 0.57mm/px · z∈[-118,+76]mm · 9 of 32 slices shown (2 of 2)]
[im 1/32]
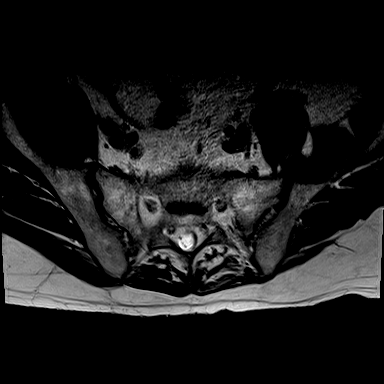
[im 4/32]
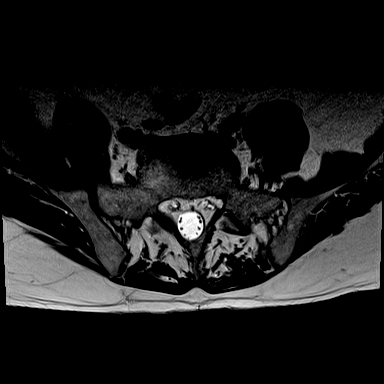
[im 8/32]
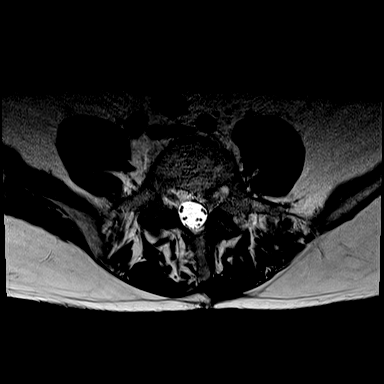
[im 12/32]
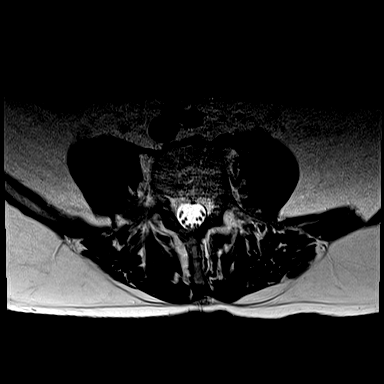
[im 16/32]
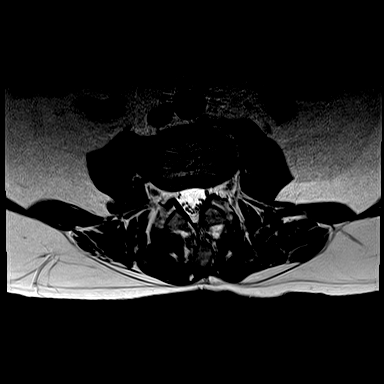
[im 20/32]
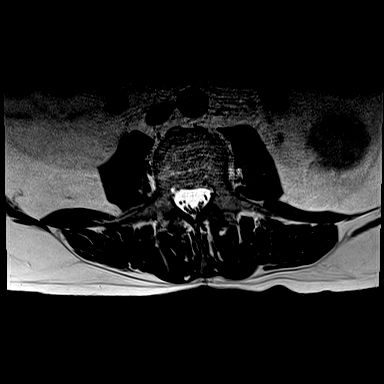
[im 24/32]
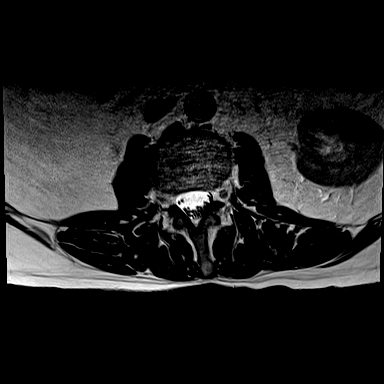
[im 28/32]
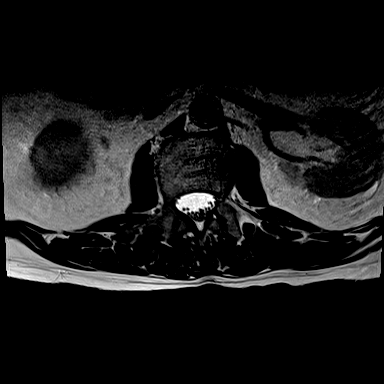
[im 32/32]
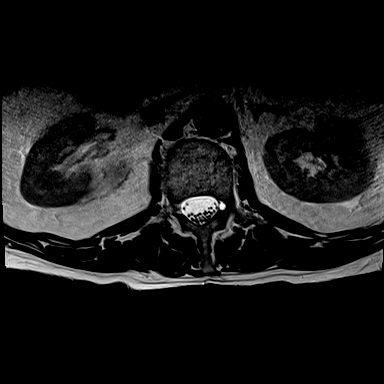

[Series 9: T1 · axial · 4.0mm · 0.34mm/px · z∈[-118,+56]mm · 6 of 32 slices shown (2 of 2)]
[im 1/32]
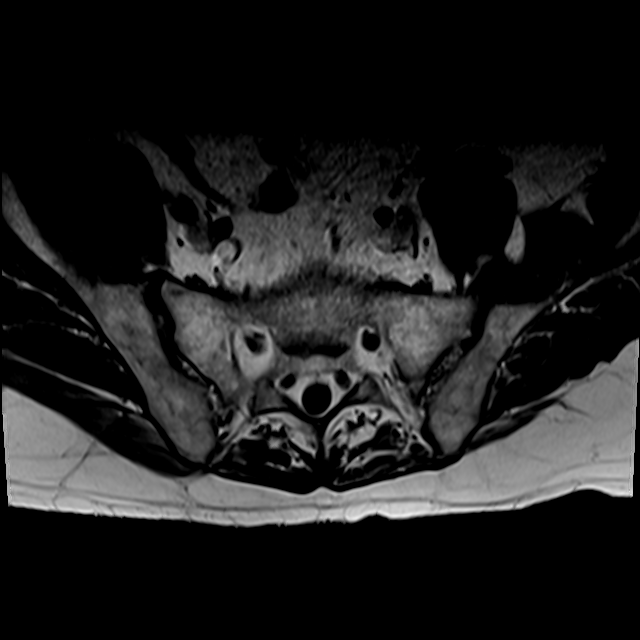
[im 4/32]
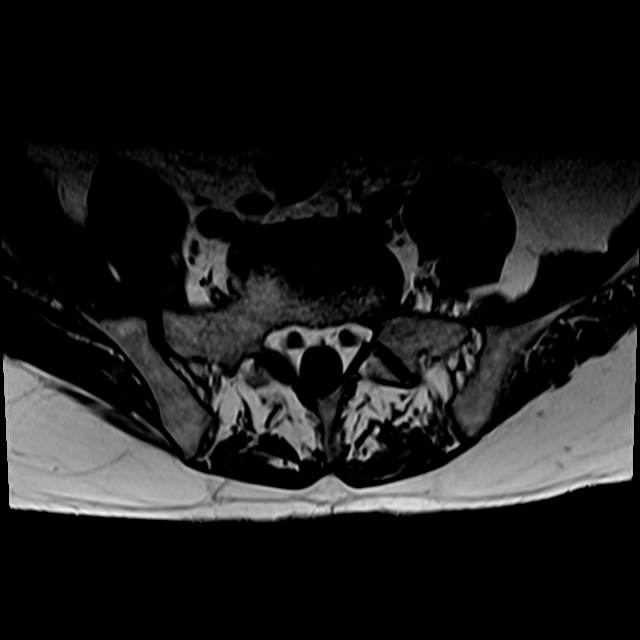
[im 8/32]
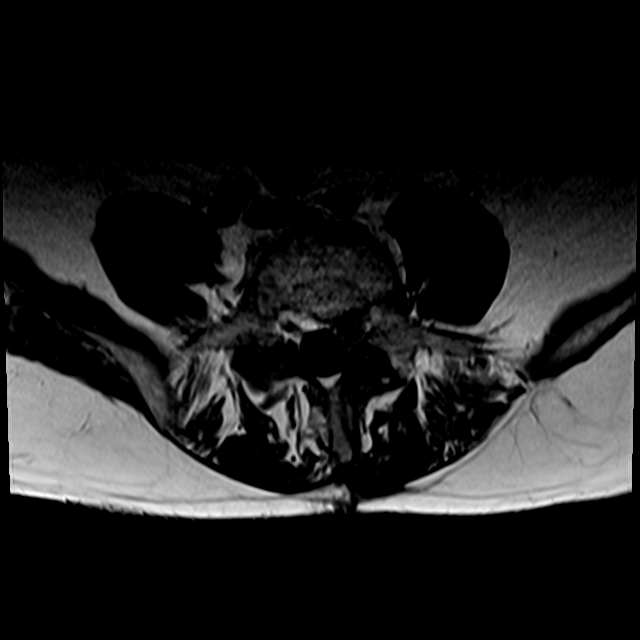
[im 12/32]
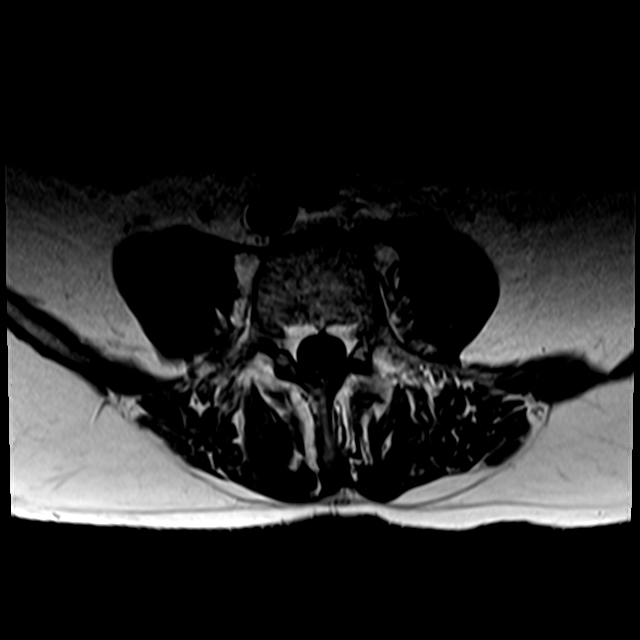
[im 16/32]
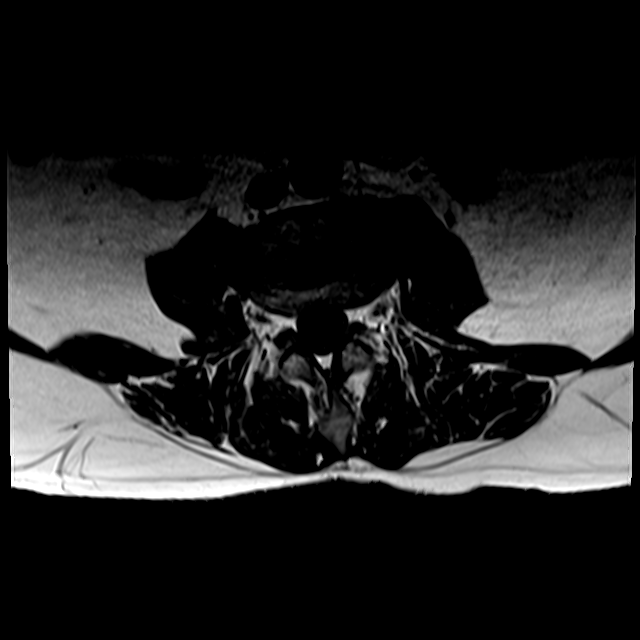
[im 28/32]
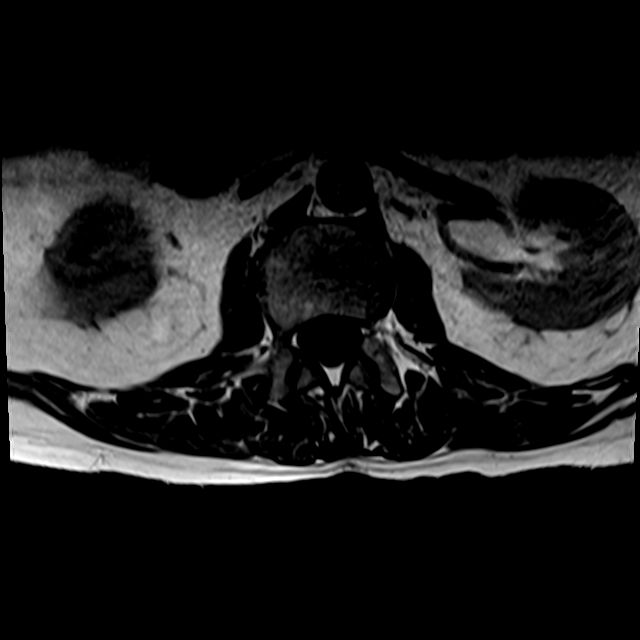

[26 of 48 positions shown; findings below may reference images not displayed]

FINDINGS: Segmentation:  Standard.

Alignment:  Physiologic.

Vertebrae:  No fracture, evidence of discitis, or bone lesion.

Conus medullaris and cauda equina: Conus extends to the T12-L1
level. Conus and cauda equina appear normal.

Paraspinal and other soft tissues: Minimal postsurgical changes at
L4-5 on the right to the expected degree. Otherwise normal.

Disc levels:

L1-2: Negative.

L2-3: Tiny disc bulge into the right neural foramen with no neural
impingement. Slight disc desiccation.

L3-4: Normal.

L4-5: There is a recurrent soft disc extrusion to the left of
midline extending into the left lateral recess compressing the left
L5 nerve best seen on image 24 of series 12 and image 11 of series
11. Postsurgical changes on the left at L4-5 to the expected degree
with enhancement after contrast.

L5-S1: Chronic disc space narrowing. Small broad-based disc bulge
with accompanying osteophytes without neural impingement, unchanged.
IMPRESSION: Recurrent soft disc extrusion at L4-5 to the left of midline
extending inferiorly in the left lateral recess compressing the left
L5 nerve.

## 2020-07-08 IMAGING — CR DG LUMBAR SPINE 1V
1 series · 1 of 1 positions shown · non-contrast
Comparison: MRI 09/07/2018

CLINICAL DATA: Redo left lumbar 4 5 micro discectomy

EXAM:
LUMBAR SPINE - 1 VIEW

[xtable lateral]
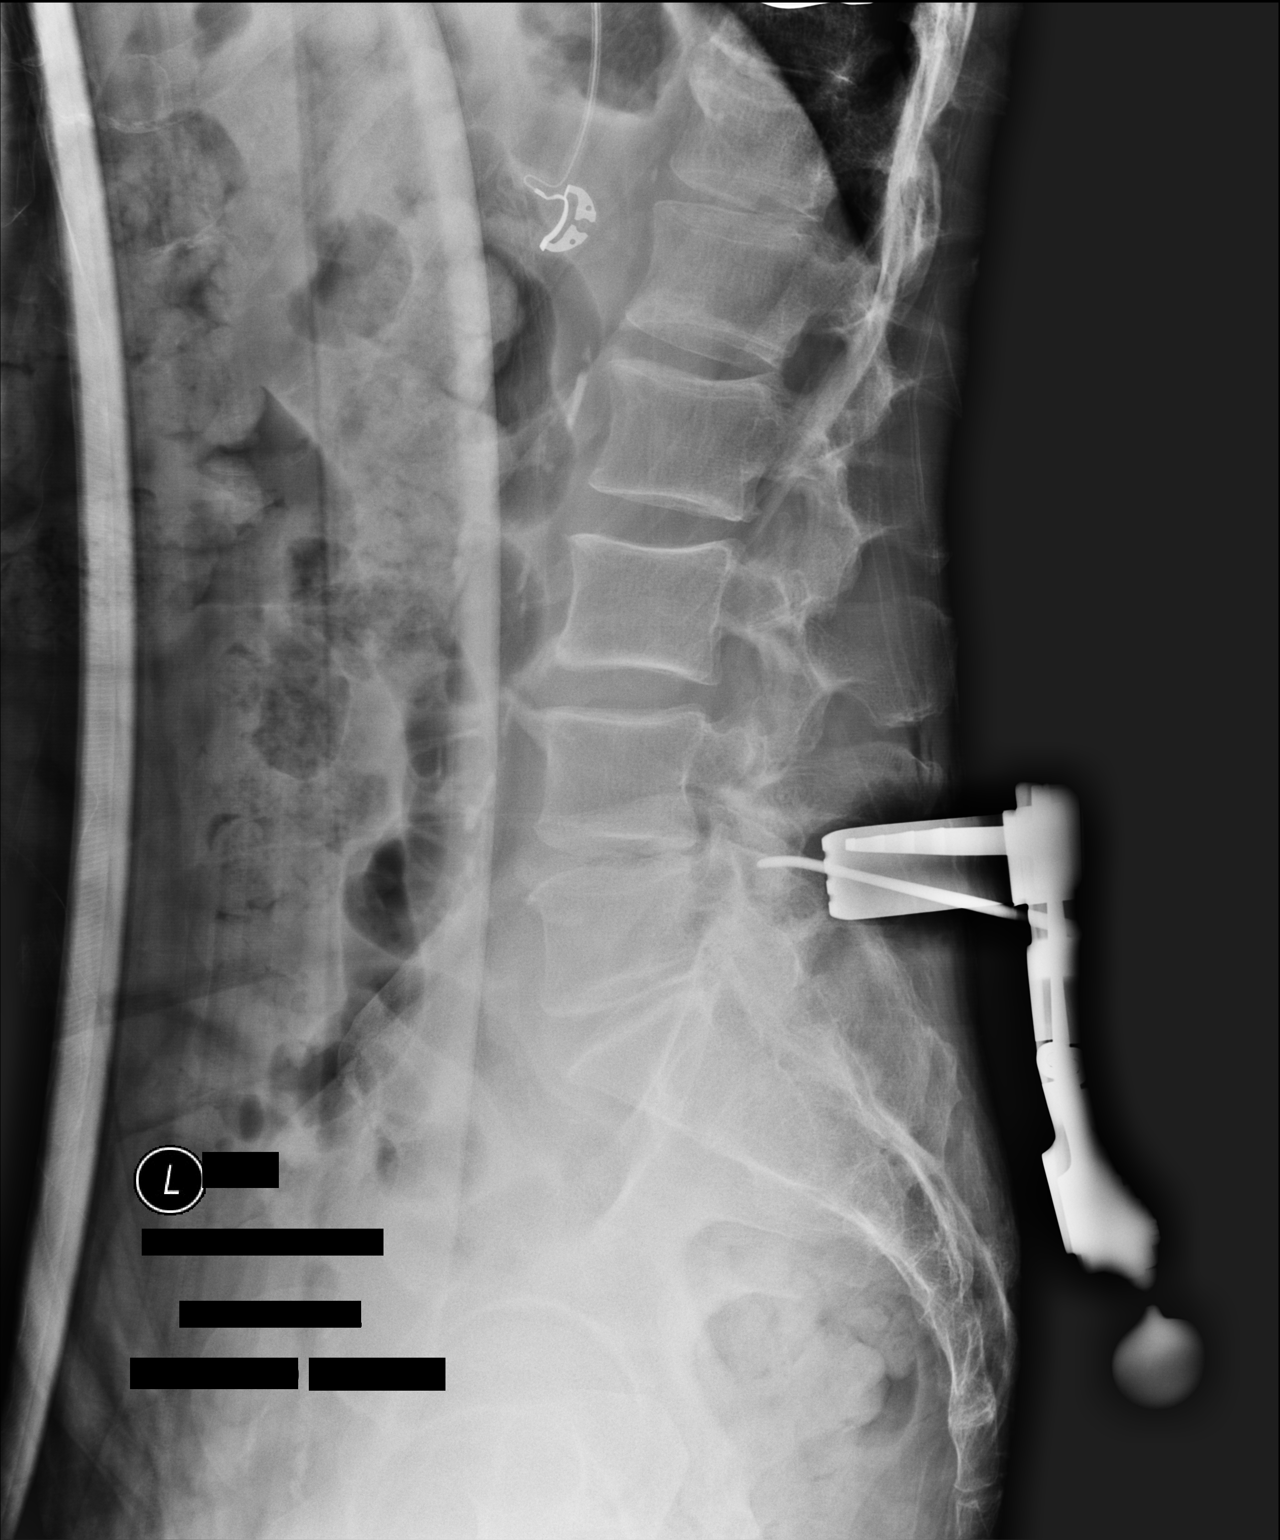

[1 of 1 positions shown; findings below may reference images not displayed]

FINDINGS: Lumbar numbering consistent with MRI 09/07/2018. Single lateral view
of the lumbar spine demonstrates surgical instruments and linear
localizing device overlying the posterior elements at the L4-L5
level.
IMPRESSION: Single lateral view of the lumbar spine obtained for localization
purposes during lumbar spine surgery.

## 2020-10-18 DIAGNOSIS — Z20822 Contact with and (suspected) exposure to covid-19: Secondary | ICD-10-CM | POA: Diagnosis not present

## 2020-10-19 DIAGNOSIS — U071 COVID-19: Secondary | ICD-10-CM | POA: Diagnosis not present

## 2020-10-19 DIAGNOSIS — I519 Heart disease, unspecified: Secondary | ICD-10-CM | POA: Diagnosis not present

## 2020-12-01 DIAGNOSIS — I10 Essential (primary) hypertension: Secondary | ICD-10-CM | POA: Diagnosis not present

## 2020-12-01 DIAGNOSIS — E785 Hyperlipidemia, unspecified: Secondary | ICD-10-CM | POA: Diagnosis not present

## 2020-12-01 DIAGNOSIS — F329 Major depressive disorder, single episode, unspecified: Secondary | ICD-10-CM | POA: Diagnosis not present

## 2020-12-01 DIAGNOSIS — I25119 Atherosclerotic heart disease of native coronary artery with unspecified angina pectoris: Secondary | ICD-10-CM | POA: Diagnosis not present

## 2020-12-01 DIAGNOSIS — H8110 Benign paroxysmal vertigo, unspecified ear: Secondary | ICD-10-CM | POA: Diagnosis not present

## 2020-12-01 DIAGNOSIS — I2510 Atherosclerotic heart disease of native coronary artery without angina pectoris: Secondary | ICD-10-CM | POA: Diagnosis not present

## 2020-12-01 DIAGNOSIS — M791 Myalgia, unspecified site: Secondary | ICD-10-CM | POA: Diagnosis not present

## 2020-12-01 DIAGNOSIS — R0609 Other forms of dyspnea: Secondary | ICD-10-CM | POA: Diagnosis not present

## 2020-12-01 DIAGNOSIS — I700 Atherosclerosis of aorta: Secondary | ICD-10-CM | POA: Diagnosis not present

## 2020-12-01 DIAGNOSIS — E663 Overweight: Secondary | ICD-10-CM | POA: Diagnosis not present

## 2020-12-01 DIAGNOSIS — F17200 Nicotine dependence, unspecified, uncomplicated: Secondary | ICD-10-CM | POA: Diagnosis not present

## 2020-12-01 DIAGNOSIS — R413 Other amnesia: Secondary | ICD-10-CM | POA: Diagnosis not present

## 2021-01-13 DIAGNOSIS — B353 Tinea pedis: Secondary | ICD-10-CM | POA: Diagnosis not present

## 2021-01-13 DIAGNOSIS — L408 Other psoriasis: Secondary | ICD-10-CM | POA: Diagnosis not present

## 2021-01-13 DIAGNOSIS — L309 Dermatitis, unspecified: Secondary | ICD-10-CM | POA: Diagnosis not present

## 2021-01-13 DIAGNOSIS — L409 Psoriasis, unspecified: Secondary | ICD-10-CM | POA: Diagnosis not present

## 2021-01-24 DIAGNOSIS — R051 Acute cough: Secondary | ICD-10-CM | POA: Diagnosis not present

## 2021-01-24 DIAGNOSIS — R0981 Nasal congestion: Secondary | ICD-10-CM | POA: Diagnosis not present

## 2021-01-24 DIAGNOSIS — Z20822 Contact with and (suspected) exposure to covid-19: Secondary | ICD-10-CM | POA: Diagnosis not present

## 2021-05-31 DIAGNOSIS — Z125 Encounter for screening for malignant neoplasm of prostate: Secondary | ICD-10-CM | POA: Diagnosis not present

## 2021-05-31 DIAGNOSIS — E785 Hyperlipidemia, unspecified: Secondary | ICD-10-CM | POA: Diagnosis not present

## 2021-06-08 DIAGNOSIS — J00 Acute nasopharyngitis [common cold]: Secondary | ICD-10-CM | POA: Diagnosis not present

## 2021-06-08 DIAGNOSIS — Z20822 Contact with and (suspected) exposure to covid-19: Secondary | ICD-10-CM | POA: Diagnosis not present

## 2021-06-09 DIAGNOSIS — F329 Major depressive disorder, single episode, unspecified: Secondary | ICD-10-CM | POA: Diagnosis not present

## 2021-06-09 DIAGNOSIS — J439 Emphysema, unspecified: Secondary | ICD-10-CM | POA: Diagnosis not present

## 2021-06-09 DIAGNOSIS — R0609 Other forms of dyspnea: Secondary | ICD-10-CM | POA: Diagnosis not present

## 2021-06-09 DIAGNOSIS — F419 Anxiety disorder, unspecified: Secondary | ICD-10-CM | POA: Diagnosis not present

## 2021-06-09 DIAGNOSIS — Z0000 Encounter for general adult medical examination without abnormal findings: Secondary | ICD-10-CM | POA: Diagnosis not present

## 2021-06-09 DIAGNOSIS — E785 Hyperlipidemia, unspecified: Secondary | ICD-10-CM | POA: Diagnosis not present

## 2021-06-09 DIAGNOSIS — R413 Other amnesia: Secondary | ICD-10-CM | POA: Diagnosis not present

## 2021-06-09 DIAGNOSIS — I10 Essential (primary) hypertension: Secondary | ICD-10-CM | POA: Diagnosis not present

## 2021-06-09 DIAGNOSIS — Z1331 Encounter for screening for depression: Secondary | ICD-10-CM | POA: Diagnosis not present

## 2021-06-09 DIAGNOSIS — R82998 Other abnormal findings in urine: Secondary | ICD-10-CM | POA: Diagnosis not present

## 2021-06-09 DIAGNOSIS — M791 Myalgia, unspecified site: Secondary | ICD-10-CM | POA: Diagnosis not present

## 2021-06-09 DIAGNOSIS — E663 Overweight: Secondary | ICD-10-CM | POA: Diagnosis not present

## 2021-06-09 DIAGNOSIS — I700 Atherosclerosis of aorta: Secondary | ICD-10-CM | POA: Diagnosis not present

## 2021-06-17 DIAGNOSIS — F17200 Nicotine dependence, unspecified, uncomplicated: Secondary | ICD-10-CM | POA: Diagnosis not present

## 2021-06-17 DIAGNOSIS — R051 Acute cough: Secondary | ICD-10-CM | POA: Diagnosis not present

## 2021-06-17 DIAGNOSIS — J439 Emphysema, unspecified: Secondary | ICD-10-CM | POA: Diagnosis not present

## 2021-06-17 DIAGNOSIS — J302 Other seasonal allergic rhinitis: Secondary | ICD-10-CM | POA: Diagnosis not present

## 2021-06-17 DIAGNOSIS — J40 Bronchitis, not specified as acute or chronic: Secondary | ICD-10-CM | POA: Diagnosis not present

## 2021-06-17 DIAGNOSIS — I10 Essential (primary) hypertension: Secondary | ICD-10-CM | POA: Diagnosis not present

## 2021-06-17 DIAGNOSIS — J069 Acute upper respiratory infection, unspecified: Secondary | ICD-10-CM | POA: Diagnosis not present

## 2021-06-17 DIAGNOSIS — Z1152 Encounter for screening for COVID-19: Secondary | ICD-10-CM | POA: Diagnosis not present

## 2021-07-04 DIAGNOSIS — Z23 Encounter for immunization: Secondary | ICD-10-CM | POA: Diagnosis not present

## 2021-07-04 DIAGNOSIS — R413 Other amnesia: Secondary | ICD-10-CM | POA: Diagnosis not present

## 2021-07-19 DIAGNOSIS — H26493 Other secondary cataract, bilateral: Secondary | ICD-10-CM | POA: Diagnosis not present

## 2021-07-19 DIAGNOSIS — Z961 Presence of intraocular lens: Secondary | ICD-10-CM | POA: Diagnosis not present

## 2021-07-28 DIAGNOSIS — Z23 Encounter for immunization: Secondary | ICD-10-CM | POA: Diagnosis not present

## 2021-07-28 DIAGNOSIS — Z79899 Other long term (current) drug therapy: Secondary | ICD-10-CM | POA: Diagnosis not present

## 2021-07-28 DIAGNOSIS — L409 Psoriasis, unspecified: Secondary | ICD-10-CM | POA: Diagnosis not present

## 2021-07-28 DIAGNOSIS — C44222 Squamous cell carcinoma of skin of right ear and external auricular canal: Secondary | ICD-10-CM | POA: Diagnosis not present

## 2021-07-28 DIAGNOSIS — D485 Neoplasm of uncertain behavior of skin: Secondary | ICD-10-CM | POA: Diagnosis not present

## 2021-08-24 DIAGNOSIS — Z23 Encounter for immunization: Secondary | ICD-10-CM | POA: Diagnosis not present

## 2021-08-24 DIAGNOSIS — L409 Psoriasis, unspecified: Secondary | ICD-10-CM | POA: Diagnosis not present

## 2021-08-24 DIAGNOSIS — T148XXD Other injury of unspecified body region, subsequent encounter: Secondary | ICD-10-CM | POA: Diagnosis not present

## 2021-09-08 DIAGNOSIS — C44222 Squamous cell carcinoma of skin of right ear and external auricular canal: Secondary | ICD-10-CM | POA: Diagnosis not present

## 2021-09-19 DIAGNOSIS — G43109 Migraine with aura, not intractable, without status migrainosus: Secondary | ICD-10-CM | POA: Diagnosis not present

## 2021-09-20 ENCOUNTER — Telehealth: Payer: Self-pay | Admitting: Cardiovascular Disease

## 2021-09-20 DIAGNOSIS — E785 Hyperlipidemia, unspecified: Secondary | ICD-10-CM

## 2021-09-20 DIAGNOSIS — I2583 Coronary atherosclerosis due to lipid rich plaque: Secondary | ICD-10-CM

## 2021-09-20 DIAGNOSIS — I10 Essential (primary) hypertension: Secondary | ICD-10-CM

## 2021-09-20 DIAGNOSIS — H538 Other visual disturbances: Secondary | ICD-10-CM

## 2021-09-20 DIAGNOSIS — I2510 Atherosclerotic heart disease of native coronary artery without angina pectoris: Secondary | ICD-10-CM

## 2021-09-20 NOTE — Telephone Encounter (Signed)
° °  Pt said, he saw his eye doctor and was told he needs to get in touch with his heart doctor and needs to have his carotid check. Pt made an appt with [name] on 02/09 at [place] office

## 2021-09-20 NOTE — Telephone Encounter (Signed)
Ordered carotid duplex per [name].

## 2021-09-20 NOTE — Telephone Encounter (Signed)
Called patient back about message. Patient stated that his eye doctor, Dr. Kerrin Champagne, recommend he follow up with his cardiologist due to him having issues with his left eye. Patient stated he has had 2 episodes of his left eye going really blurry, seeing lights and spots in the last 3 months. Patient stated he was driving at the time of both episodes. Patient saw Dr. Kerrin Champagne yesterday, note in chart. Patient has schedule office visit in [place] on 10/20/21 with [name]. Will see if [name] wants to get carotid duplex done before visit.

## 2021-09-29 ENCOUNTER — Ambulatory Visit (HOSPITAL_COMMUNITY)
Admission: RE | Admit: 2021-09-29 | Discharge: 2021-09-29 | Disposition: A | Discharge status: Home / Self Care | Payer: Medicare Other | Source: Ambulatory Visit | Attending: Cardiovascular Disease | Admitting: Cardiovascular Disease

## 2021-09-29 ENCOUNTER — Other Ambulatory Visit: Payer: Self-pay

## 2021-09-29 DIAGNOSIS — I10 Essential (primary) hypertension: Secondary | ICD-10-CM | POA: Insufficient documentation

## 2021-09-29 DIAGNOSIS — I2510 Atherosclerotic heart disease of native coronary artery without angina pectoris: Secondary | ICD-10-CM | POA: Diagnosis not present

## 2021-09-29 DIAGNOSIS — Z79899 Other long term (current) drug therapy: Secondary | ICD-10-CM | POA: Diagnosis not present

## 2021-09-29 DIAGNOSIS — I2583 Coronary atherosclerosis due to lipid rich plaque: Secondary | ICD-10-CM | POA: Insufficient documentation

## 2021-09-29 DIAGNOSIS — H538 Other visual disturbances: Secondary | ICD-10-CM | POA: Diagnosis not present

## 2021-09-29 DIAGNOSIS — H53122 Transient visual loss, left eye: Secondary | ICD-10-CM

## 2021-09-29 DIAGNOSIS — E785 Hyperlipidemia, unspecified: Secondary | ICD-10-CM | POA: Diagnosis not present

## 2021-10-06 DIAGNOSIS — J069 Acute upper respiratory infection, unspecified: Secondary | ICD-10-CM | POA: Diagnosis not present

## 2021-10-06 DIAGNOSIS — Z20822 Contact with and (suspected) exposure to covid-19: Secondary | ICD-10-CM | POA: Diagnosis not present

## 2021-10-20 ENCOUNTER — Ambulatory Visit: Payer: Medicare Other | Admitting: Cardiovascular Disease

## 2021-10-26 DIAGNOSIS — M5481 Occipital neuralgia: Secondary | ICD-10-CM | POA: Diagnosis not present

## 2021-10-26 DIAGNOSIS — S161XXA Strain of muscle, fascia and tendon at neck level, initial encounter: Secondary | ICD-10-CM | POA: Diagnosis not present

## 2021-11-03 DIAGNOSIS — G43109 Migraine with aura, not intractable, without status migrainosus: Secondary | ICD-10-CM | POA: Diagnosis not present

## 2021-12-06 DIAGNOSIS — H8110 Benign paroxysmal vertigo, unspecified ear: Secondary | ICD-10-CM | POA: Diagnosis not present

## 2021-12-06 DIAGNOSIS — M791 Myalgia, unspecified site: Secondary | ICD-10-CM | POA: Diagnosis not present

## 2021-12-06 DIAGNOSIS — M542 Cervicalgia: Secondary | ICD-10-CM | POA: Diagnosis not present

## 2021-12-06 DIAGNOSIS — F329 Major depressive disorder, single episode, unspecified: Secondary | ICD-10-CM | POA: Diagnosis not present

## 2021-12-06 DIAGNOSIS — M9907 Segmental and somatic dysfunction of upper extremity: Secondary | ICD-10-CM | POA: Diagnosis not present

## 2021-12-06 DIAGNOSIS — R413 Other amnesia: Secondary | ICD-10-CM | POA: Diagnosis not present

## 2021-12-06 DIAGNOSIS — I700 Atherosclerosis of aorta: Secondary | ICD-10-CM | POA: Diagnosis not present

## 2021-12-06 DIAGNOSIS — M9900 Segmental and somatic dysfunction of head region: Secondary | ICD-10-CM | POA: Diagnosis not present

## 2021-12-06 DIAGNOSIS — I10 Essential (primary) hypertension: Secondary | ICD-10-CM | POA: Diagnosis not present

## 2021-12-06 DIAGNOSIS — G43109 Migraine with aura, not intractable, without status migrainosus: Secondary | ICD-10-CM | POA: Diagnosis not present

## 2021-12-06 DIAGNOSIS — E785 Hyperlipidemia, unspecified: Secondary | ICD-10-CM | POA: Diagnosis not present

## 2021-12-06 DIAGNOSIS — R0609 Other forms of dyspnea: Secondary | ICD-10-CM | POA: Diagnosis not present

## 2021-12-06 DIAGNOSIS — M9908 Segmental and somatic dysfunction of rib cage: Secondary | ICD-10-CM | POA: Diagnosis not present

## 2021-12-06 DIAGNOSIS — Z720 Tobacco use: Secondary | ICD-10-CM | POA: Diagnosis not present

## 2021-12-06 DIAGNOSIS — M9902 Segmental and somatic dysfunction of thoracic region: Secondary | ICD-10-CM | POA: Diagnosis not present

## 2021-12-06 DIAGNOSIS — I25118 Atherosclerotic heart disease of native coronary artery with other forms of angina pectoris: Secondary | ICD-10-CM | POA: Diagnosis not present

## 2021-12-06 DIAGNOSIS — M9901 Segmental and somatic dysfunction of cervical region: Secondary | ICD-10-CM | POA: Diagnosis not present

## 2021-12-20 DIAGNOSIS — I10 Essential (primary) hypertension: Secondary | ICD-10-CM | POA: Diagnosis not present

## 2021-12-20 DIAGNOSIS — M791 Myalgia, unspecified site: Secondary | ICD-10-CM | POA: Diagnosis not present

## 2021-12-20 DIAGNOSIS — M9901 Segmental and somatic dysfunction of cervical region: Secondary | ICD-10-CM | POA: Diagnosis not present

## 2021-12-20 DIAGNOSIS — G43109 Migraine with aura, not intractable, without status migrainosus: Secondary | ICD-10-CM | POA: Diagnosis not present

## 2021-12-20 DIAGNOSIS — J439 Emphysema, unspecified: Secondary | ICD-10-CM | POA: Diagnosis not present

## 2021-12-20 DIAGNOSIS — M9907 Segmental and somatic dysfunction of upper extremity: Secondary | ICD-10-CM | POA: Diagnosis not present

## 2021-12-20 DIAGNOSIS — R0609 Other forms of dyspnea: Secondary | ICD-10-CM | POA: Diagnosis not present

## 2021-12-20 DIAGNOSIS — M542 Cervicalgia: Secondary | ICD-10-CM | POA: Diagnosis not present

## 2021-12-20 DIAGNOSIS — I25118 Atherosclerotic heart disease of native coronary artery with other forms of angina pectoris: Secondary | ICD-10-CM | POA: Diagnosis not present

## 2021-12-20 DIAGNOSIS — R413 Other amnesia: Secondary | ICD-10-CM | POA: Diagnosis not present

## 2021-12-20 DIAGNOSIS — F17200 Nicotine dependence, unspecified, uncomplicated: Secondary | ICD-10-CM | POA: Diagnosis not present

## 2021-12-20 DIAGNOSIS — M9902 Segmental and somatic dysfunction of thoracic region: Secondary | ICD-10-CM | POA: Diagnosis not present

## 2021-12-20 DIAGNOSIS — M9908 Segmental and somatic dysfunction of rib cage: Secondary | ICD-10-CM | POA: Diagnosis not present

## 2021-12-20 DIAGNOSIS — M1990 Unspecified osteoarthritis, unspecified site: Secondary | ICD-10-CM | POA: Diagnosis not present

## 2021-12-20 DIAGNOSIS — M9900 Segmental and somatic dysfunction of head region: Secondary | ICD-10-CM | POA: Diagnosis not present

## 2021-12-20 DIAGNOSIS — F419 Anxiety disorder, unspecified: Secondary | ICD-10-CM | POA: Diagnosis not present

## 2021-12-20 DIAGNOSIS — E785 Hyperlipidemia, unspecified: Secondary | ICD-10-CM | POA: Diagnosis not present

## 2021-12-20 DIAGNOSIS — I700 Atherosclerosis of aorta: Secondary | ICD-10-CM | POA: Diagnosis not present

## 2022-01-03 NOTE — Progress Notes (Incomplete)
? ?Cardiology Office Note ? ? ?Date:  01/03/2022  ? ?ID:  Clayton Braun, DOB 10-12-1950, MRN [id number] ? ?PCP:  Clayton Margarita, DO  ?Cardiologist:  [name]  Not seen since 2019 ? ?No chief complaint on file. ? ? ?  ?History of Present Illness: ?Clayton Braun is a 71 y.o. male who presents for f/U of CAD  ? ?He was previously followed by Dr. Ron Parker. Cardiac hx includes PCI to RCA in 2002. Cath in 2009 showed patent stent and a 50% mLAD lesion. He had a stress test 12/12/2011 that was normal with no ischemia.  ?  ?Anginal equivalent noted to be flushing sensation. He continued to smoke 1PPD. Was followed with pulmonary in [place] however is displeased with his care.  ?  ?Myovue 03/21/19 normal no ischemia EF 70% ?  ?Cath 01/05/20 reviewed no obstructive disease patent stent in proximal RCA  ? ?Has had some visual blurring in left eye and ophthalmologist suggested carotid duplex be done 09/29/21 minimal plaque no stenosis  ? ?*** ? ?Past Medical History:  ?Diagnosis Date  ? Abdominal pain   ? Acute respiratory disease   ? Allergic rhinitis   ? Allergies   ? Anxiety   ? 09/09/18- not in a long time  ? AP (abdominal pain)   ? Blood in stool   ? Body mass index 27.0-27.9, adult   ? Bradycardia, sinus   ? Bronchitis   ? Candidiasis   ? ORAL  ? Chest pain   ? March, 2013  ? Coronary artery disease   ? Stent to RCA, 2002   /    cath 2009, stent patent, residual 50% LAD  ? Depression   ? 09/09/2018- not in a while  ? Diverticulosis 2006  ? COLONOSCOPY DR. Lindalou Braun, H/O POLYP  ? Dizziness   ? occasionaly  ? Dyslipidemia   ? Fatigue   ? Gastroenteritis and colitis, viral   ? GERD (gastroesophageal reflux disease)   ? Hypercholesterolemia   ? Hyperkalemia   ? Per the patient, 2013  ? Hyperlipidemia   ? Hypertension   ? Hypertriglyceridemia   ? Nummular eczema   ? Osteoarthritis   ? Pharyngitis   ? Pityriasis rosea   ? Pneumonia   ? Psoriasis   ? Rash   ? Rhinosinusitis   ? Sinusitis   ? Skin neoplasm   ? Tinea cruris   ?  Wart   ? ? ?Past Surgical History:  ?Procedure Laterality Date  ? APPENDECTOMY    ? CARDIAC CATHETERIZATION    ? 2002  ? COLONOSCOPY    ? ESOPHAGOGASTRODUODENOSCOPY    ? EYE SURGERY    ? implants, cataracts  ? HERNIA REPAIR Right   ? Inguinal  ? LEFT HEART CATH AND CORONARY ANGIOGRAPHY N/A 01/05/2020  ? Procedure: LEFT HEART CATH AND CORONARY ANGIOGRAPHY;  Surgeon: Clayton Blanks, MD;  Location: [facility];  Service: Cardiovascular;  Laterality: N/A;  ? LUMBAR LAMINECTOMY/DECOMPRESSION MICRODISCECTOMY Left 03/22/2018  ? Procedure: Left Lumbar four-five Microdiscectomy;  Surgeon: Clayton Levine, MD;  Location: [facility];  Service: Neurosurgery;  Laterality: Left;  ? LUMBAR LAMINECTOMY/DECOMPRESSION MICRODISCECTOMY Left 09/10/2018  ? Procedure: Redo Left Lumbar four-five Microdiscectomy;  Surgeon: Clayton Levine, MD;  Location: [facility];  Service: Neurosurgery;  Laterality: Left;  ? NASAL SINUS SURGERY    ? PENILE PROSTHESIS PLACEMENT    ? ? ? ?Current Outpatient Medications  ?Medication Sig Dispense Refill  ? ALPRAZolam (XANAX) 1 MG  tablet Take 1 mg by mouth at bedtime.   2  ? aspirin EC 81 MG tablet Take 81 mg by mouth daily.    ? atenolol (TENORMIN) 25 MG tablet Take 25 mg by mouth daily.     ? atorvastatin (LIPITOR) 10 MG tablet Take 10 mg by mouth at bedtime.      ? FOLBIC 2.5-25-2 MG TABS Take 1 tablet by mouth Daily.    ? gemfibrozil (LOPID) 600 MG tablet Take 600 mg by mouth 2 (two) times daily.      ? montelukast (SINGULAIR) 10 MG tablet Take 10 mg by mouth at bedtime.    ? Multiple Vitamin (MULTIVITAMIN WITH MINERALS) TABS tablet Take 1 tablet by mouth daily. Centrum Silver for Men 50+    ? pantoprazole (PROTONIX) 40 MG tablet Take 40 mg by mouth at bedtime.     ? PARoxetine (PAXIL) 40 MG tablet Take 40 mg by mouth at bedtime.     ? Polyethyl Glycol-Propyl Glycol (SYSTANE ULTRA OP) Place 1 drop into both eyes daily as needed (dry eyes).    ? ramipril (ALTACE) 2.5 MG capsule Take 2.5 mg by mouth daily.       ? ?No current facility-administered medications for this visit.  ? ? ?Allergies:   Niacin and related  ? ? ?Social History:  The patient  reports that he has been smoking cigarettes. He has a 36.00 pack-year smoking history. He has never used smokeless tobacco. He reports that he does not currently use alcohol. He reports that he does not use drugs.  ? ?Family History:  The patient's family history includes COPD in his father; Heart attack in his mother; Heart disease in an other family member; Lung disease in an other family member.  ? ? ?ROS:  General:no colds or fevers, no weight changes ?Skin:no rashes or ulcers ?HEENT:no blurred vision, no congestion ?CV:see HPI ?PUL:see HPI ?GI:no diarrhea constipation or melena, no indigestion ?GU:no hematuria, no dysuria ?MS:no joint pain, no claudication ?Neuro:no syncope, no lightheadedness ?Endo:no diabetes, no thyroid disease ? ?Wt Readings from Last 3 Encounters:  ?01/26/20 162 lb (73.5 kg)  ?01/05/20 162 lb (73.5 kg)  ?12/30/19 161 lb 12.8 oz (73.4 kg)  ?  ? ?PHYSICAL EXAM: ?VS:  There were no vitals taken for this visit. , BMI There is no height or weight on file to calculate BMI. ?General:Pleasant affect, NAD ?Skin:Warm and dry, brisk capillary refill ?HEENT:normocephalic, sclera clear, mucus membranes moist ?Heart:S1S2 RRR without murmur, gallup, rub or click, Rt wrist cath site without hematoma and has healed.  ?Lungs:clear without rales, rhonchi, or wheezes ?OVF:IEPP, non tender, + BS, do not palpate liver spleen or masses ?Ext:no lower ext edema, 2+ pedal pulses, 2+ radial pulses ?Neuro:alert and oriented X 3, MAE, follows commands, + facial symmetry ? ? ? ?EKG:  EKG is NOT ordered today. ? ? ?Recent Labs: ?No results found for requested labs within last 8760 hours.  ? ? ?Lipid Panel ?   ?Component Value Date/Time  ? CHOL  09/26/2007 0534  ?  117        ?ATP III CLASSIFICATION: ? <200     mg/dL   Desirable ? 200-239  mg/dL   Borderline High ? >=240    mg/dL    High  ? TRIG 191 (H) 09/26/2007 0534  ? HDL 24 (L) 09/26/2007 0534  ? CHOLHDL 4.9 09/26/2007 0534  ? VLDL 38 09/26/2007 0534  ? [place]  09/26/2007 0534  ?  55        ?  Total Cholesterol/HDL:CHD Risk ?Coronary Heart Disease Risk Table ?                    Men   Women ? 1/2 Average Risk   3.4   3.3  ? ?  ? ? ?Other studies Reviewed: ?Additional studies/ records that were reviewed today include: . ?Cardiac cath 01/05/20 ?Prox RCA-1 lesion is 30% stenosed. ?Prox RCA-2 lesion is 30% stenosed. ?Prox LAD to Mid LAD lesion is 40% stenosed. ?The left ventricular systolic function is normal. ?LV end diastolic pressure is normal. ?The left ventricular ejection fraction is 55-65% by visual estimate. ?There is no mitral valve regurgitation. ?  ?1. Mild non-obstructive disease in the mid LAD ?2. Patent stent proximal RCA with minimal in stent restenosis.  ?3. Normal LV systolic function ?4. Normal LVEDP ?  ?Recommendations: Continue medical management of CAD ? ?ASSESSMENT AND PLAN: ? ?1.  CAD with hx of stent to RCA with 30% in stent restenosis.  Otherwise cath results with non obstructive disease 01/05/20 .  His symptoms have resolved.  Continue ASA BB and ACE. ? ?2.  HLD on statin and followed by PCP   ? ?3.   HTN controlled to low if dizzy or lightheaded would hold ACE only on 2.5 mg and has normal EF.  ? ?4. Tobacco use, is aware he needs to stop.last lung cancer CT 01/20/20 benign will reorder  ? ?Labs/ tests ordered today include: Lung cancer CT  ? ?Disposition:   FU:in a year  ? ?Signed, ?Jenkins Rouge, MD  ?01/03/2022 12:41 PM    ?[facility] [address], [place], [place] ?[place], [place] ?Phone: [phone]; Fax: [phone]  ?[phone] ?

## 2022-01-09 DIAGNOSIS — J22 Unspecified acute lower respiratory infection: Secondary | ICD-10-CM | POA: Diagnosis not present

## 2022-01-09 DIAGNOSIS — Z20822 Contact with and (suspected) exposure to covid-19: Secondary | ICD-10-CM | POA: Diagnosis not present

## 2022-01-09 DIAGNOSIS — F17210 Nicotine dependence, cigarettes, uncomplicated: Secondary | ICD-10-CM | POA: Diagnosis not present

## 2022-01-12 ENCOUNTER — Ambulatory Visit: Payer: Medicare Other | Admitting: Cardiovascular Disease

## 2022-01-19 ENCOUNTER — Ambulatory Visit: Payer: Medicare Other | Admitting: Psychiatry

## 2022-02-08 DIAGNOSIS — T83410A Breakdown (mechanical) of penile (implanted) prosthesis, initial encounter: Secondary | ICD-10-CM | POA: Diagnosis not present

## 2022-02-08 DIAGNOSIS — R399 Unspecified symptoms and signs involving the genitourinary system: Secondary | ICD-10-CM | POA: Diagnosis not present

## 2022-04-10 DIAGNOSIS — M1990 Unspecified osteoarthritis, unspecified site: Secondary | ICD-10-CM | POA: Diagnosis not present

## 2022-04-10 DIAGNOSIS — K5792 Diverticulitis of intestine, part unspecified, without perforation or abscess without bleeding: Secondary | ICD-10-CM | POA: Diagnosis not present

## 2022-04-10 DIAGNOSIS — K5790 Diverticulosis of intestine, part unspecified, without perforation or abscess without bleeding: Secondary | ICD-10-CM | POA: Diagnosis not present

## 2022-04-10 DIAGNOSIS — R1030 Lower abdominal pain, unspecified: Secondary | ICD-10-CM | POA: Diagnosis not present

## 2022-04-20 DIAGNOSIS — L821 Other seborrheic keratosis: Secondary | ICD-10-CM | POA: Diagnosis not present

## 2022-04-20 DIAGNOSIS — D485 Neoplasm of uncertain behavior of skin: Secondary | ICD-10-CM | POA: Diagnosis not present

## 2022-04-20 DIAGNOSIS — D0421 Carcinoma in situ of skin of right ear and external auricular canal: Secondary | ICD-10-CM | POA: Diagnosis not present

## 2022-04-20 DIAGNOSIS — L570 Actinic keratosis: Secondary | ICD-10-CM | POA: Diagnosis not present

## 2022-05-10 DIAGNOSIS — C44222 Squamous cell carcinoma of skin of right ear and external auricular canal: Secondary | ICD-10-CM | POA: Diagnosis not present

## 2022-05-10 DIAGNOSIS — L570 Actinic keratosis: Secondary | ICD-10-CM | POA: Diagnosis not present

## 2022-05-17 DIAGNOSIS — I2510 Atherosclerotic heart disease of native coronary artery without angina pectoris: Secondary | ICD-10-CM | POA: Diagnosis not present

## 2022-05-17 DIAGNOSIS — F17210 Nicotine dependence, cigarettes, uncomplicated: Secondary | ICD-10-CM | POA: Diagnosis not present

## 2022-05-17 DIAGNOSIS — Z20822 Contact with and (suspected) exposure to covid-19: Secondary | ICD-10-CM | POA: Diagnosis not present

## 2022-05-17 DIAGNOSIS — R079 Chest pain, unspecified: Secondary | ICD-10-CM | POA: Diagnosis not present

## 2022-05-17 DIAGNOSIS — J9601 Acute respiratory failure with hypoxia: Secondary | ICD-10-CM | POA: Diagnosis not present

## 2022-05-17 DIAGNOSIS — Z79899 Other long term (current) drug therapy: Secondary | ICD-10-CM | POA: Diagnosis not present

## 2022-05-17 DIAGNOSIS — J209 Acute bronchitis, unspecified: Secondary | ICD-10-CM | POA: Diagnosis not present

## 2022-05-17 DIAGNOSIS — Z955 Presence of coronary angioplasty implant and graft: Secondary | ICD-10-CM | POA: Diagnosis not present

## 2022-05-17 DIAGNOSIS — J441 Chronic obstructive pulmonary disease with (acute) exacerbation: Secondary | ICD-10-CM | POA: Diagnosis not present

## 2022-05-17 DIAGNOSIS — Z841 Family history of disorders of kidney and ureter: Secondary | ICD-10-CM | POA: Diagnosis not present

## 2022-05-17 DIAGNOSIS — F419 Anxiety disorder, unspecified: Secondary | ICD-10-CM | POA: Diagnosis not present

## 2022-05-17 DIAGNOSIS — J440 Chronic obstructive pulmonary disease with acute lower respiratory infection: Secondary | ICD-10-CM | POA: Diagnosis not present

## 2022-05-18 DIAGNOSIS — F17210 Nicotine dependence, cigarettes, uncomplicated: Secondary | ICD-10-CM | POA: Diagnosis not present

## 2022-05-18 DIAGNOSIS — I2510 Atherosclerotic heart disease of native coronary artery without angina pectoris: Secondary | ICD-10-CM | POA: Diagnosis not present

## 2022-05-18 DIAGNOSIS — R0602 Shortness of breath: Secondary | ICD-10-CM | POA: Diagnosis not present

## 2022-05-18 DIAGNOSIS — R0902 Hypoxemia: Secondary | ICD-10-CM | POA: Diagnosis not present

## 2022-05-18 DIAGNOSIS — J440 Chronic obstructive pulmonary disease with acute lower respiratory infection: Secondary | ICD-10-CM | POA: Diagnosis not present

## 2022-05-18 DIAGNOSIS — Z955 Presence of coronary angioplasty implant and graft: Secondary | ICD-10-CM | POA: Diagnosis not present

## 2022-05-18 DIAGNOSIS — Z20822 Contact with and (suspected) exposure to covid-19: Secondary | ICD-10-CM | POA: Diagnosis not present

## 2022-05-18 DIAGNOSIS — J020 Streptococcal pharyngitis: Secondary | ICD-10-CM | POA: Diagnosis not present

## 2022-05-18 DIAGNOSIS — Z720 Tobacco use: Secondary | ICD-10-CM | POA: Diagnosis not present

## 2022-05-18 DIAGNOSIS — Z841 Family history of disorders of kidney and ureter: Secondary | ICD-10-CM | POA: Diagnosis not present

## 2022-05-18 DIAGNOSIS — J9601 Acute respiratory failure with hypoxia: Secondary | ICD-10-CM | POA: Diagnosis not present

## 2022-05-18 DIAGNOSIS — F419 Anxiety disorder, unspecified: Secondary | ICD-10-CM | POA: Diagnosis not present

## 2022-05-18 DIAGNOSIS — J209 Acute bronchitis, unspecified: Secondary | ICD-10-CM | POA: Diagnosis not present

## 2022-05-18 DIAGNOSIS — Z79899 Other long term (current) drug therapy: Secondary | ICD-10-CM | POA: Diagnosis not present

## 2022-05-18 DIAGNOSIS — A419 Sepsis, unspecified organism: Secondary | ICD-10-CM | POA: Diagnosis not present

## 2022-05-18 DIAGNOSIS — J441 Chronic obstructive pulmonary disease with (acute) exacerbation: Secondary | ICD-10-CM | POA: Diagnosis not present

## 2022-05-18 DIAGNOSIS — R059 Cough, unspecified: Secondary | ICD-10-CM | POA: Diagnosis not present

## 2022-05-30 DIAGNOSIS — F419 Anxiety disorder, unspecified: Secondary | ICD-10-CM | POA: Diagnosis not present

## 2022-05-30 DIAGNOSIS — L409 Psoriasis, unspecified: Secondary | ICD-10-CM | POA: Diagnosis not present

## 2022-05-30 DIAGNOSIS — M609 Myositis, unspecified: Secondary | ICD-10-CM | POA: Diagnosis not present

## 2022-05-30 DIAGNOSIS — M542 Cervicalgia: Secondary | ICD-10-CM | POA: Diagnosis not present

## 2022-05-30 DIAGNOSIS — I10 Essential (primary) hypertension: Secondary | ICD-10-CM | POA: Diagnosis not present

## 2022-05-30 DIAGNOSIS — E785 Hyperlipidemia, unspecified: Secondary | ICD-10-CM | POA: Diagnosis not present

## 2022-05-30 DIAGNOSIS — J439 Emphysema, unspecified: Secondary | ICD-10-CM | POA: Diagnosis not present

## 2022-05-30 DIAGNOSIS — J441 Chronic obstructive pulmonary disease with (acute) exacerbation: Secondary | ICD-10-CM | POA: Diagnosis not present

## 2022-05-30 DIAGNOSIS — I25118 Atherosclerotic heart disease of native coronary artery with other forms of angina pectoris: Secondary | ICD-10-CM | POA: Diagnosis not present

## 2022-05-30 DIAGNOSIS — F329 Major depressive disorder, single episode, unspecified: Secondary | ICD-10-CM | POA: Diagnosis not present

## 2022-05-30 DIAGNOSIS — H8110 Benign paroxysmal vertigo, unspecified ear: Secondary | ICD-10-CM | POA: Diagnosis not present

## 2022-05-30 DIAGNOSIS — I700 Atherosclerosis of aorta: Secondary | ICD-10-CM | POA: Diagnosis not present

## 2022-06-15 DIAGNOSIS — E785 Hyperlipidemia, unspecified: Secondary | ICD-10-CM | POA: Diagnosis not present

## 2022-06-15 DIAGNOSIS — I10 Essential (primary) hypertension: Secondary | ICD-10-CM | POA: Diagnosis not present

## 2022-06-15 DIAGNOSIS — Z125 Encounter for screening for malignant neoplasm of prostate: Secondary | ICD-10-CM | POA: Diagnosis not present

## 2022-06-15 DIAGNOSIS — R7989 Other specified abnormal findings of blood chemistry: Secondary | ICD-10-CM | POA: Diagnosis not present

## 2022-06-22 ENCOUNTER — Encounter: Payer: Self-pay | Admitting: Pulmonary Disease

## 2022-06-22 ENCOUNTER — Ambulatory Visit (INDEPENDENT_AMBULATORY_CARE_PROVIDER_SITE_OTHER): Payer: Medicare Other | Admitting: Pulmonary Disease

## 2022-06-22 VITALS — BP 110/68 | HR 63 | Temp 97.7°F | Ht 66.0 in | Wt 173.0 lb

## 2022-06-22 DIAGNOSIS — I10 Essential (primary) hypertension: Secondary | ICD-10-CM | POA: Diagnosis not present

## 2022-06-22 DIAGNOSIS — R82998 Other abnormal findings in urine: Secondary | ICD-10-CM | POA: Diagnosis not present

## 2022-06-22 DIAGNOSIS — R0609 Other forms of dyspnea: Secondary | ICD-10-CM | POA: Diagnosis not present

## 2022-06-22 DIAGNOSIS — M609 Myositis, unspecified: Secondary | ICD-10-CM | POA: Diagnosis not present

## 2022-06-22 DIAGNOSIS — I700 Atherosclerosis of aorta: Secondary | ICD-10-CM | POA: Diagnosis not present

## 2022-06-22 DIAGNOSIS — I25118 Atherosclerotic heart disease of native coronary artery with other forms of angina pectoris: Secondary | ICD-10-CM | POA: Diagnosis not present

## 2022-06-22 DIAGNOSIS — E785 Hyperlipidemia, unspecified: Secondary | ICD-10-CM | POA: Diagnosis not present

## 2022-06-22 DIAGNOSIS — H8110 Benign paroxysmal vertigo, unspecified ear: Secondary | ICD-10-CM | POA: Diagnosis not present

## 2022-06-22 DIAGNOSIS — F329 Major depressive disorder, single episode, unspecified: Secondary | ICD-10-CM | POA: Diagnosis not present

## 2022-06-22 DIAGNOSIS — J441 Chronic obstructive pulmonary disease with (acute) exacerbation: Secondary | ICD-10-CM | POA: Diagnosis not present

## 2022-06-22 DIAGNOSIS — Z23 Encounter for immunization: Secondary | ICD-10-CM | POA: Diagnosis not present

## 2022-06-22 DIAGNOSIS — Z1331 Encounter for screening for depression: Secondary | ICD-10-CM | POA: Diagnosis not present

## 2022-06-22 DIAGNOSIS — R413 Other amnesia: Secondary | ICD-10-CM | POA: Diagnosis not present

## 2022-06-22 DIAGNOSIS — Z1389 Encounter for screening for other disorder: Secondary | ICD-10-CM | POA: Diagnosis not present

## 2022-06-22 DIAGNOSIS — Z0000 Encounter for general adult medical examination without abnormal findings: Secondary | ICD-10-CM | POA: Diagnosis not present

## 2022-06-22 DIAGNOSIS — F419 Anxiety disorder, unspecified: Secondary | ICD-10-CM | POA: Diagnosis not present

## 2022-06-22 NOTE — Progress Notes (Signed)
Clayton Braun    [id number]    Jun 28, 1951  Primary Care Physician:Skakle, Liane Comber, DO  Referring Physician: Sueanne Margarita, [address] [place],  [place] [address]  Chief complaint:   Patient being seen for breathing issues  HPI:  Was recently hospitalized for COPD exacerbation Spent about 4 days in the hospital in [place], had what he felt was a bronchitis but led to hospitalization  Had a CT scan of the chest about a year ago showing evidence of emphysema  He does get short of breath with activity  Currently on Breo and feels Memory Dance is working well, does have some dysphonia  Inhaler technique was reviewed during the visit today  Did discuss the pathophysiology of obstructive lung disease  He quit smoking about 5 weeks ago  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   ALPRAZolam (XANAX) 1 MG tablet Take 1 mg by mouth at bedtime.   aspirin EC 81 MG tablet Take 81 mg by mouth daily.   atenolol (TENORMIN) 25 MG tablet Take 25 mg by mouth daily.    atorvastatin (LIPITOR) 10 MG tablet Take 10 mg by mouth at bedtime.     fluticasone furoate-vilanterol (BREO ELLIPTA) 100-25 MCG/ACT AEPB Inhale 1 puff into the lungs daily.   FOLBIC 2.5-25-2 MG TABS Take 1 tablet by mouth Daily.   gemfibrozil (LOPID) 600 MG tablet Take 600 mg by mouth 2 (two) times daily.     montelukast (SINGULAIR) 10 MG tablet Take 10 mg by mouth at bedtime.   Multiple Vitamin (MULTIVITAMIN WITH MINERALS) TABS tablet Take 1 tablet by mouth daily. Centrum Silver for Men 50+   nabumetone (RELAFEN) 500 MG tablet Take 500 mg by mouth 2 (two) times daily as needed.   pantoprazole (PROTONIX) 40 MG tablet Take 40 mg by mouth at bedtime.    PARoxetine (PAXIL) 40 MG tablet Take 40 mg by mouth at bedtime.     Polyethyl Glycol-Propyl Glycol (SYSTANE ULTRA OP) Place 1 drop into both eyes daily as needed (dry eyes).   predniSONE (DELTASONE) 20 MG tablet Take by mouth.   ramipril (ALTACE) 2.5 MG capsule Take 2.5 mg by mouth daily.     No facility-administered encounter medications on file as of 06/22/2022.    Allergies as of 06/22/2022 - Review Complete 06/22/2022  Allergen Reaction Noted   Niacin and related Other (See Comments) 12/07/2011    Past Medical History:  Diagnosis Date   Abdominal pain    Acute respiratory disease    Allergic rhinitis    Allergies    Anxiety    09/09/18- not in a long time   AP (abdominal pain)    Blood in stool    Body mass index 27.0-27.9, adult    Bradycardia, sinus    Bronchitis    Candidiasis    ORAL   Chest pain    March, 2013   Coronary artery disease    Stent to RCA, 2002   /    cath 2009, stent patent, residual 50% LAD   Depression    09/09/2018- not in a while   Diverticulosis 2006   COLONOSCOPY DR. Lindalou Hose, H/O POLYP   Dizziness    occasionaly   Dyslipidemia    Fatigue    Gastroenteritis  and colitis, viral    GERD (gastroesophageal reflux disease)    Hypercholesterolemia    Hyperkalemia    Per the patient, 2013   Hyperlipidemia    Hypertension    Hypertriglyceridemia    Nummular eczema    Osteoarthritis    Pharyngitis    Pityriasis rosea    Pneumonia    Psoriasis    Rash    Rhinosinusitis    Sinusitis    Skin neoplasm    Tinea cruris    Wart     Past Surgical History:  Procedure Laterality Date   APPENDECTOMY     CARDIAC CATHETERIZATION     2002   COLONOSCOPY     ESOPHAGOGASTRODUODENOSCOPY     EYE SURGERY     implants, cataracts   HERNIA REPAIR Right    Inguinal   LEFT HEART CATH AND CORONARY ANGIOGRAPHY N/A 01/05/2020   Procedure: LEFT HEART CATH AND CORONARY ANGIOGRAPHY;  Surgeon: Burnell Blanks, MD;  Location: [facility];  Service: Cardiovascular;  Laterality: N/A;   LUMBAR  LAMINECTOMY/DECOMPRESSION MICRODISCECTOMY Left 03/22/2018   Procedure: Left Lumbar four-five Microdiscectomy;  Surgeon: Erline Levine, MD;  Location: [facility];  Service: Neurosurgery;  Laterality: Left;   LUMBAR LAMINECTOMY/DECOMPRESSION MICRODISCECTOMY Left 09/10/2018   Procedure: Redo Left Lumbar four-five Microdiscectomy;  Surgeon: Erline Levine, MD;  Location: [facility];  Service: Neurosurgery;  Laterality: Left;   NASAL SINUS SURGERY     PENILE PROSTHESIS PLACEMENT      Family History  Problem Relation Age of Onset   Lung disease Other    Heart disease Other    Heart attack Mother    COPD Father     Social History   Socioeconomic History   Marital status: Married    Spouse name: Not on file   Number of children: Not on file   Years of education: Not on file   Highest education level: Not on file  Occupational History   Occupation: Full time    Employer: [employer]  Tobacco Use   Smoking status: Every Day    Packs/day: 0.75    Years: 48.00    Total pack years: 36.00    Types: Cigarettes   Smokeless tobacco: Never  Vaping Use   Vaping Use: Never used  Substance and Sexual Activity   Alcohol use: Not Currently   Drug use: No   Sexual activity: Not Currently  Other Topics Concern   Not on file  Social History Narrative   No regular exercise.   Social Determinants of Health   Financial Resource Strain: Not on file  Food Insecurity: Not on file  Transportation Needs: Not on file  Physical Activity: Not on file  Stress: Not on file  Social Connections: Not on file  Intimate Partner Violence: Not on file    Review of Systems  Respiratory:  Positive for shortness of breath.     Vitals:   06/22/22 1538  BP: 110/68  Pulse: 63  Temp: 97.7 F (36.5 C)     Physical Exam Constitutional:      Appearance: Normal appearance.  HENT:     Head: Normocephalic.     Mouth/Throat:     Mouth: Mucous membranes are moist.  Cardiovascular:     Rate and Rhythm:  Normal rate and regular rhythm.     Heart sounds: No murmur heard.    No friction rub.  Pulmonary:     Effort: No respiratory distress.     Breath  sounds: No stridor. No wheezing or rhonchi.  Neurological:     Mental Status: He is alert.  Psychiatric:        Mood and Affect: Mood normal.     Data Reviewed: CT scan of the chest 01/20/2020 reviewed with the patient showing emphysema, and middle lobe right lung nodule improved from previous  He had a CT scan during recent hospitalization-result not available  Summary from hospitalization 05/17/2022 reviewed showing chest x-ray prominent interstitial markings but no infiltrate   Assessment:  Chronic obstructive pulmonary disease  Emphysema  Recent bronchitis  Shortness of breath on exertion    Plan/Recommendations: Encouraged to continue Breo -Encouraged to rinse after use of inhaler -May consider discontinuing inhaler if he continues to feel well however, if he feels this is helping his breathing,-I did encourage him to continue -He does have some dysphonia  Inhaler technique was reviewed during the visit today  Schedule for pulmonary function tests  Regarding anticipated surgery -He has no preclusion to undergoing surgery -He is cleared from a respiratory perspective for surgery -Pulmonary function test should not preclude surgery  Encouraged to call with any significant concerns  Follow-up in 3 months  Importance of graded exercises discussed   Sherrilyn Rist MD [facility] 06/22/2022, 4:50 PM  CC: Sueanne Margarita, DO

## 2022-06-22 NOTE — Patient Instructions (Signed)
PFT at next visit  Request for recent CT scan to compare with previous, [place]  Continue using Breo  Rescue inhaler use as needed  Graded exercise as tolerated  You are cleared for surgery from my perspective  Call with significant concerns  Follow-up in about 3 months

## 2022-07-18 DIAGNOSIS — C44222 Squamous cell carcinoma of skin of right ear and external auricular canal: Secondary | ICD-10-CM | POA: Diagnosis not present

## 2022-07-18 DIAGNOSIS — L570 Actinic keratosis: Secondary | ICD-10-CM | POA: Diagnosis not present

## 2022-07-18 DIAGNOSIS — Z8589 Personal history of malignant neoplasm of other organs and systems: Secondary | ICD-10-CM | POA: Diagnosis not present

## 2022-07-18 DIAGNOSIS — Z5189 Encounter for other specified aftercare: Secondary | ICD-10-CM | POA: Diagnosis not present

## 2022-08-14 DIAGNOSIS — Z79899 Other long term (current) drug therapy: Secondary | ICD-10-CM | POA: Diagnosis not present

## 2022-08-14 DIAGNOSIS — L409 Psoriasis, unspecified: Secondary | ICD-10-CM | POA: Diagnosis not present

## 2022-08-17 DIAGNOSIS — L409 Psoriasis, unspecified: Secondary | ICD-10-CM | POA: Diagnosis not present

## 2022-08-17 DIAGNOSIS — Z79899 Other long term (current) drug therapy: Secondary | ICD-10-CM | POA: Diagnosis not present

## 2022-08-25 DIAGNOSIS — Z79899 Other long term (current) drug therapy: Secondary | ICD-10-CM | POA: Diagnosis not present

## 2022-08-25 DIAGNOSIS — L400 Psoriasis vulgaris: Secondary | ICD-10-CM | POA: Diagnosis not present

## 2022-08-31 DIAGNOSIS — L400 Psoriasis vulgaris: Secondary | ICD-10-CM | POA: Diagnosis not present

## 2022-10-02 DIAGNOSIS — L400 Psoriasis vulgaris: Secondary | ICD-10-CM | POA: Diagnosis not present

## 2022-10-30 DIAGNOSIS — J029 Acute pharyngitis, unspecified: Secondary | ICD-10-CM | POA: Diagnosis not present

## 2022-10-30 DIAGNOSIS — Z20822 Contact with and (suspected) exposure to covid-19: Secondary | ICD-10-CM | POA: Diagnosis not present

## 2022-11-29 DIAGNOSIS — M7022 Olecranon bursitis, left elbow: Secondary | ICD-10-CM | POA: Diagnosis not present

## 2022-12-25 DIAGNOSIS — L400 Psoriasis vulgaris: Secondary | ICD-10-CM | POA: Diagnosis not present

## 2023-03-14 DIAGNOSIS — F329 Major depressive disorder, single episode, unspecified: Secondary | ICD-10-CM | POA: Diagnosis not present

## 2023-03-14 DIAGNOSIS — I10 Essential (primary) hypertension: Secondary | ICD-10-CM | POA: Diagnosis not present

## 2023-03-14 DIAGNOSIS — H8110 Benign paroxysmal vertigo, unspecified ear: Secondary | ICD-10-CM | POA: Diagnosis not present

## 2023-03-14 DIAGNOSIS — M609 Myositis, unspecified: Secondary | ICD-10-CM | POA: Diagnosis not present

## 2023-03-14 DIAGNOSIS — R0609 Other forms of dyspnea: Secondary | ICD-10-CM | POA: Diagnosis not present

## 2023-03-14 DIAGNOSIS — J441 Chronic obstructive pulmonary disease with (acute) exacerbation: Secondary | ICD-10-CM | POA: Diagnosis not present

## 2023-03-14 DIAGNOSIS — R5383 Other fatigue: Secondary | ICD-10-CM | POA: Diagnosis not present

## 2023-03-14 DIAGNOSIS — I700 Atherosclerosis of aorta: Secondary | ICD-10-CM | POA: Diagnosis not present

## 2023-03-14 DIAGNOSIS — E785 Hyperlipidemia, unspecified: Secondary | ICD-10-CM | POA: Diagnosis not present

## 2023-03-14 DIAGNOSIS — I25118 Atherosclerotic heart disease of native coronary artery with other forms of angina pectoris: Secondary | ICD-10-CM | POA: Diagnosis not present

## 2023-03-14 DIAGNOSIS — F17200 Nicotine dependence, unspecified, uncomplicated: Secondary | ICD-10-CM | POA: Diagnosis not present

## 2023-03-14 DIAGNOSIS — J439 Emphysema, unspecified: Secondary | ICD-10-CM | POA: Diagnosis not present

## 2023-03-19 DIAGNOSIS — L400 Psoriasis vulgaris: Secondary | ICD-10-CM | POA: Diagnosis not present

## 2023-03-20 DIAGNOSIS — I2510 Atherosclerotic heart disease of native coronary artery without angina pectoris: Secondary | ICD-10-CM | POA: Diagnosis not present

## 2023-03-20 DIAGNOSIS — Z79899 Other long term (current) drug therapy: Secondary | ICD-10-CM | POA: Diagnosis not present

## 2023-03-20 DIAGNOSIS — R519 Headache, unspecified: Secondary | ICD-10-CM | POA: Diagnosis not present

## 2023-03-20 DIAGNOSIS — K219 Gastro-esophageal reflux disease without esophagitis: Secondary | ICD-10-CM | POA: Diagnosis not present

## 2023-03-20 DIAGNOSIS — J849 Interstitial pulmonary disease, unspecified: Secondary | ICD-10-CM | POA: Diagnosis not present

## 2023-03-20 DIAGNOSIS — R0789 Other chest pain: Secondary | ICD-10-CM | POA: Diagnosis not present

## 2023-03-20 DIAGNOSIS — Z955 Presence of coronary angioplasty implant and graft: Secondary | ICD-10-CM | POA: Diagnosis not present

## 2023-03-20 DIAGNOSIS — F17210 Nicotine dependence, cigarettes, uncomplicated: Secondary | ICD-10-CM | POA: Diagnosis not present

## 2023-03-20 DIAGNOSIS — R200 Anesthesia of skin: Secondary | ICD-10-CM | POA: Diagnosis not present

## 2023-03-20 DIAGNOSIS — Z888 Allergy status to other drugs, medicaments and biological substances status: Secondary | ICD-10-CM | POA: Diagnosis not present

## 2023-03-20 DIAGNOSIS — R410 Disorientation, unspecified: Secondary | ICD-10-CM | POA: Diagnosis not present

## 2023-03-20 DIAGNOSIS — R9082 White matter disease, unspecified: Secondary | ICD-10-CM | POA: Diagnosis not present

## 2023-03-20 DIAGNOSIS — R42 Dizziness and giddiness: Secondary | ICD-10-CM | POA: Diagnosis not present

## 2023-03-20 DIAGNOSIS — I6782 Cerebral ischemia: Secondary | ICD-10-CM | POA: Diagnosis not present

## 2023-03-20 DIAGNOSIS — R4781 Slurred speech: Secondary | ICD-10-CM | POA: Diagnosis not present

## 2023-03-20 DIAGNOSIS — H538 Other visual disturbances: Secondary | ICD-10-CM | POA: Diagnosis not present

## 2023-03-20 DIAGNOSIS — J449 Chronic obstructive pulmonary disease, unspecified: Secondary | ICD-10-CM | POA: Diagnosis not present

## 2023-03-20 DIAGNOSIS — E7800 Pure hypercholesterolemia, unspecified: Secondary | ICD-10-CM | POA: Diagnosis not present

## 2023-03-20 DIAGNOSIS — G9389 Other specified disorders of brain: Secondary | ICD-10-CM | POA: Diagnosis not present

## 2023-03-20 DIAGNOSIS — I10 Essential (primary) hypertension: Secondary | ICD-10-CM | POA: Diagnosis not present

## 2023-03-21 DIAGNOSIS — I10 Essential (primary) hypertension: Secondary | ICD-10-CM | POA: Diagnosis not present

## 2023-03-21 DIAGNOSIS — R079 Chest pain, unspecified: Secondary | ICD-10-CM | POA: Diagnosis not present

## 2023-03-21 DIAGNOSIS — I6389 Other cerebral infarction: Secondary | ICD-10-CM | POA: Diagnosis not present

## 2023-03-21 DIAGNOSIS — Z79899 Other long term (current) drug therapy: Secondary | ICD-10-CM | POA: Diagnosis not present

## 2023-03-21 DIAGNOSIS — E785 Hyperlipidemia, unspecified: Secondary | ICD-10-CM | POA: Diagnosis not present

## 2023-03-21 DIAGNOSIS — R200 Anesthesia of skin: Secondary | ICD-10-CM | POA: Diagnosis not present

## 2023-03-21 DIAGNOSIS — R0789 Other chest pain: Secondary | ICD-10-CM | POA: Diagnosis not present

## 2023-03-21 DIAGNOSIS — R42 Dizziness and giddiness: Secondary | ICD-10-CM | POA: Diagnosis not present

## 2023-03-22 DIAGNOSIS — Z79899 Other long term (current) drug therapy: Secondary | ICD-10-CM | POA: Diagnosis not present

## 2023-03-22 DIAGNOSIS — R200 Anesthesia of skin: Secondary | ICD-10-CM | POA: Diagnosis not present

## 2023-03-22 DIAGNOSIS — R0789 Other chest pain: Secondary | ICD-10-CM | POA: Diagnosis not present

## 2023-03-22 DIAGNOSIS — R42 Dizziness and giddiness: Secondary | ICD-10-CM | POA: Diagnosis not present

## 2023-03-22 DIAGNOSIS — E785 Hyperlipidemia, unspecified: Secondary | ICD-10-CM | POA: Diagnosis not present

## 2023-03-22 DIAGNOSIS — I10 Essential (primary) hypertension: Secondary | ICD-10-CM | POA: Diagnosis not present

## 2023-03-26 DIAGNOSIS — M5412 Radiculopathy, cervical region: Secondary | ICD-10-CM | POA: Diagnosis not present

## 2023-03-26 DIAGNOSIS — M5090 Cervical disc disorder, unspecified, unspecified cervical region: Secondary | ICD-10-CM | POA: Diagnosis not present

## 2023-03-29 DIAGNOSIS — E7800 Pure hypercholesterolemia, unspecified: Secondary | ICD-10-CM | POA: Diagnosis not present

## 2023-03-29 DIAGNOSIS — T782XXA Anaphylactic shock, unspecified, initial encounter: Secondary | ICD-10-CM | POA: Diagnosis not present

## 2023-03-29 DIAGNOSIS — T7840XA Allergy, unspecified, initial encounter: Secondary | ICD-10-CM | POA: Diagnosis not present

## 2023-03-29 DIAGNOSIS — Z91030 Bee allergy status: Secondary | ICD-10-CM | POA: Diagnosis not present

## 2023-03-29 DIAGNOSIS — T63441A Toxic effect of venom of bees, accidental (unintentional), initial encounter: Secondary | ICD-10-CM | POA: Diagnosis not present

## 2023-03-29 DIAGNOSIS — I10 Essential (primary) hypertension: Secondary | ICD-10-CM | POA: Diagnosis not present

## 2023-04-03 DIAGNOSIS — M5412 Radiculopathy, cervical region: Secondary | ICD-10-CM | POA: Diagnosis not present

## 2023-04-04 DIAGNOSIS — I639 Cerebral infarction, unspecified: Secondary | ICD-10-CM | POA: Diagnosis not present

## 2023-04-04 DIAGNOSIS — M542 Cervicalgia: Secondary | ICD-10-CM | POA: Diagnosis not present

## 2023-04-04 DIAGNOSIS — Z6826 Body mass index (BMI) 26.0-26.9, adult: Secondary | ICD-10-CM | POA: Diagnosis not present

## 2023-04-16 ENCOUNTER — Encounter: Payer: Self-pay | Admitting: Neurology

## 2023-04-16 ENCOUNTER — Telehealth: Payer: Self-pay | Admitting: Neurology

## 2023-04-16 ENCOUNTER — Ambulatory Visit (INDEPENDENT_AMBULATORY_CARE_PROVIDER_SITE_OTHER): Payer: Medicare Other | Admitting: Neurology

## 2023-04-16 VITALS — BP 110/67 | HR 76 | Ht 66.0 in | Wt 164.8 lb

## 2023-04-16 DIAGNOSIS — G43009 Migraine without aura, not intractable, without status migrainosus: Secondary | ICD-10-CM

## 2023-04-16 DIAGNOSIS — R413 Other amnesia: Secondary | ICD-10-CM | POA: Diagnosis not present

## 2023-04-16 DIAGNOSIS — I69398 Other sequelae of cerebral infarction: Secondary | ICD-10-CM

## 2023-04-16 DIAGNOSIS — G3184 Mild cognitive impairment, so stated: Secondary | ICD-10-CM | POA: Diagnosis not present

## 2023-04-16 DIAGNOSIS — R209 Unspecified disturbances of skin sensation: Secondary | ICD-10-CM

## 2023-04-16 DIAGNOSIS — R202 Paresthesia of skin: Secondary | ICD-10-CM

## 2023-04-16 MED ORDER — TOPIRAMATE 25 MG PO TABS: 25.0000 mg | ORAL_TABLET | Freq: Two times a day (BID) | ORAL | 3 refills | Status: AC

## 2023-04-16 NOTE — Telephone Encounter (Signed)
Sent to GI [phone]

## 2023-04-16 NOTE — Patient Instructions (Signed)
I had a long discussion with the patient and his wife regarding his episodes of recurrent left face and hand paresthesias and discussed differential diagnosis between poststroke paresthesias, atypical migraine and simple partial seizures.  He also has mild cognitive impairment.  I recommended treatment trial of Topamax 25 mg twice daily and to increase as tolerated.  Check dementia panel labs, EEG, MRI scan of the brain with and without contrast.  Continue aspirin for stroke prevention and maintain aggressive risk factor modification with strict control of hypertension with blood pressure goal below 140/90, lipids with LDL cholesterol goal below 70 mg percent and diabetes with hemoglobin A1c goal below 6.5%.  I also encouraged him to do cognitively challenging activities like solving crossword puzzles, playing bridge and sudoku.  We also discussed memory compensation strategies.  He will return for follow-up in the future in 3 to 4 months or call earlier if necessary. Memory Compensation Strategies  Use "WARM" strategy.  W= write it down  A= associate it  R= repeat it  M= make a mental note  2.   You can keep a Glass blower/designer.  Use a 3-ring notebook with sections for the following: calendar, important names and phone numbers,  medications, doctors' names/phone numbers, lists/reminders, and a section to journal what you did  each day.   3.    Use a calendar to write appointments down.  4.    Write yourself a schedule for the day.  This can be placed on the calendar or in a separate section of the Memory Notebook.  Keeping a  regular schedule can help memory.  5.    Use medication organizer with sections for each day or morning/evening pills.  You may need help loading it  6.    Keep a basket, or pegboard by the door.  Place items that you need to take out with you in the basket or on the pegboard.  You may also want to  include a message board for reminders.  7.    Use sticky notes.  Place  sticky notes with reminders in a place where the task is performed.  For example: " turn off the  stove" placed by the stove, "lock the door" placed on the door at eye level, " take your medications" on  the bathroom mirror or by the place where you normally take your medications.  8.    Use alarms/timers.  Use while cooking to remind yourself to check on food or as a reminder to take your medicine, or as a  reminder to make a call, or as a reminder to perform another task, etc.  Stroke Prevention Some medical conditions and behaviors can lead to a higher chance of having a stroke. You can help prevent a stroke by eating healthy, exercising, not smoking, and managing any medical conditions you have. Stroke is a leading cause of functional impairment. Primary prevention is particularly important because a majority of strokes are first-time events. Stroke changes the lives of not only those who experience a stroke but also their family and other caregivers. How can this condition affect me? A stroke is a medical emergency and should be treated right away. A stroke can lead to brain damage and can sometimes be life-threatening. If a person gets medical treatment right away, there is a better chance of surviving and recovering from a stroke. What can increase my risk? The following medical conditions may increase your risk of a stroke: Cardiovascular disease. High blood pressure (  hypertension). Diabetes. High cholesterol. Sickle cell disease. Blood clotting disorders (hypercoagulable state). Obesity. Sleep disorders (obstructive sleep apnea). Other risk factors include: Being older than age 67. Having a history of blood clots, stroke, or mini-stroke (transient ischemic attack, TIA). Genetic factors, such as race, ethnicity, or a family history of stroke. Smoking cigarettes or using other tobacco products. Taking birth control pills, especially if you also use tobacco. Heavy use of alcohol or  drugs, especially cocaine and methamphetamine. Physical inactivity. What actions can I take to prevent this? Manage your health conditions High cholesterol levels. Eating a healthy diet is important for preventing high cholesterol. If cholesterol cannot be managed through diet alone, you may need to take medicines. Take any prescribed medicines to control your cholesterol as told by your health care provider. Hypertension. To reduce your risk of stroke, try to keep your blood pressure below 130/80. Eating a healthy diet and exercising regularly are important for controlling blood pressure. If these steps are not enough to manage your blood pressure, you may need to take medicines. Take any prescribed medicines to control hypertension as told by your health care provider. Ask your health care provider if you should monitor your blood pressure at home. Have your blood pressure checked every year, even if your blood pressure is normal. Blood pressure increases with age and some medical conditions. Diabetes. Eating a healthy diet and exercising regularly are important parts of managing your blood sugar (glucose). If your blood sugar cannot be managed through diet and exercise, you may need to take medicines. Take any prescribed medicines to control your diabetes as told by your health care provider. Get evaluated for obstructive sleep apnea. Talk to your health care provider about getting a sleep evaluation if you snore a lot or have excessive sleepiness. Make sure that any other medical conditions you have, such as atrial fibrillation or atherosclerosis, are managed. Nutrition Follow instructions from your health care provider about what to eat or drink to help manage your health condition. These instructions may include: Reducing your daily calorie intake. Limiting how much salt (sodium) you use to 1,500 milligrams (mg) each day. Using only healthy fats for cooking, such as olive oil, canola oil,  or sunflower oil. Eating healthy foods. You can do this by: Choosing foods that are high in fiber, such as whole grains, and fresh fruits and vegetables. Eating at least 5 servings of fruits and vegetables a day. Try to fill one-half of your plate with fruits and vegetables at each meal. Choosing lean protein foods, such as lean cuts of meat, poultry without skin, fish, tofu, beans, and nuts. Eating low-fat dairy products. Avoiding foods that are high in sodium. This can help lower blood pressure. Avoiding foods that have saturated fat, trans fat, and cholesterol. This can help prevent high cholesterol. Avoiding processed and prepared foods. Counting your daily carbohydrate intake.  Lifestyle If you drink alcohol: Limit how much you have to: 0-1 drink a day for women who are not pregnant. 0-2 drinks a day for men. Know how much alcohol is in your drink. In the [place] one drink equals one 12 oz bottle of beer ( ), one 5 oz glass of wine ( ), or one 1 oz glass of hard liquor (44mL). Do not use any products that contain nicotine or tobacco. These products include cigarettes, chewing tobacco, and vaping devices, such as e-cigarettes. If you need help quitting, ask your health care provider. Avoid secondhand smoke. Do not use drugs. Activity  Try to  stay at a healthy weight. Get at least 30 minutes of exercise on most days, such as: Fast walking. Biking. Swimming. Medicines Take over-the-counter and prescription medicines only as told by your health care provider. Aspirin or blood thinners (antiplatelets or anticoagulants) may be recommended to reduce your risk of forming blood clots that can lead to stroke. Avoid taking birth control pills. Talk to your health care provider about the risks of taking birth control pills if: You are over 47 years old. You smoke. You get very bad headaches. You have had a blood clot. Where to find more information American Stroke Association:  www.strokeassociation.org Get help right away if: You or a loved one has any symptoms of a stroke. "BE FAST" is an easy way to remember the main warning signs of a stroke: B - Balance. Signs are dizziness, sudden trouble walking, or loss of balance. E - Eyes. Signs are trouble seeing or a sudden change in vision. F - Face. Signs are sudden weakness or numbness of the face, or the face or eyelid drooping on one side. A - Arms. Signs are weakness or numbness in an arm. This happens suddenly and usually on one side of the body. S - Speech. Signs are sudden trouble speaking, slurred speech, or trouble understanding what people say. T - Time. Time to call emergency services. Write down what time symptoms started. You or a loved one has other signs of a stroke, such as: A sudden, severe headache with no known cause. Nausea or vomiting. Seizure. These symptoms may represent a serious problem that is an emergency. Do not wait to see if the symptoms will go away. Get medical help right away. Call your local emergency services (911 in the [place] Do not drive yourself to the hospital. Summary You can help to prevent a stroke by eating healthy, exercising, not smoking, limiting alcohol intake, and managing any medical conditions you may have. Do not use any products that contain nicotine or tobacco. These include cigarettes, chewing tobacco, and vaping devices, such as e-cigarettes. If you need help quitting, ask your health care provider. Remember "BE FAST" for warning signs of a stroke. Get help right away if you or a loved one has any of these signs. This information is not intended to replace advice given to you by your health care provider. Make sure you discuss any questions you have with your health care provider. Document Revised: 07/31/2022 Document Reviewed: 07/31/2022 Elsevier Patient Education  2024 [employer].

## 2023-04-16 NOTE — Progress Notes (Signed)
[facility] [address] [place]. [place] [address] [phone]       OFFICE CONSULT NOTE  Mr. Clayton Braun Date of Birth:  29-Oct-1950 Medical Record Number:  [id number]   Referring MD: Alinda Money Dawley  Reason for Referral: History of stroke  HPI: Clayton Braun is a 72 year old pleasant Caucasian male seen today for initial office consultation visit.  He is accompanied by his wife Clayton Braun  History is obtained from them and review of electronic medical records.  I personally reviewed pertinent available imaging films in PACS.  He has past medical history of Hypertension, hyperlipidemia, gastroesophageal reflux disease, fatigue, dizziness, bronchitis, anxiety and depression.  Patient presented to the ER in [place] on 03/20/2023 with sudden onset of left hand and face paresthesias.  He was admitted at [facility] for for 3 days.  He describes these as starting in his fingers and then gradually spreading within minutes to involve his left cheek and face.  This continues to occur intermittently several times a day lasting few minutes for 10 days or so.  CT and CT angiogram of the brain and neck were negative.  MRI could not be obtainilityl.  Echocardiogram showed ejection fraction of 55 to 60%.  Hemoglobin A1c was 5.2.  LDL cholesterol 40 mg percent.  Patient was discharged on dual antiplatelet therapy aspirin and Plavix for 3 weeks followed by aspirin alone.  Patient states he is tolerating his medications well without bruising or bleeding however these episodes are continuing.  At times he gets some paresthesias in his left lip and at times in his fingers and at times in the paresthesias started in the fingers and then progressed to involve her face.  He is also noticed headache accompanying most of these episodes.  It is mostly in the occipital region.  Does have some vision disturbance with this as well which is mostly blurred vision.  He does have prior history  of migraines as well as some ocular migraine episodes.  He never had numbness and tingling with those episodes in the past.  Patient has also noticed some diminished short-term memory since the last 2 3 weeks.  He has trouble remembering names at times as well as recent information.  In fact forgot directions to family or place once and got lost.  He is otherwise independent in activities of daily living at home.  There is no family history of dementia or Alzheimer's.  He has undergone MRI scan of the C-spine recently and was found to have some moderate foraminal narrowing and was referred to see neurosurgery who felt he is not a candidate for any surgical intervention at this time.  He denies any prior history of depression, seizures, significant head injury with loss of consciousness or strokes. ROS:   14 system review of systems is positive for tingling, numbness, memory difficulties all other systems negative  PMH:  Past Medical History:  Diagnosis Date   Abdominal pain    Acute respiratory disease    Allergic rhinitis    Allergies    Anxiety    09/09/18- not in a long time   AP (abdominal pain)    Blood in stool    Body mass index 27.0-27.9, adult    Bradycardia, sinus    Bronchitis    Candidiasis    ORAL   Chest pain    March, 2013   Coronary artery disease    Stent to RCA, 2002   /    cath  2009, stent patent, residual 50% LAD   Depression    09/09/2018- not in a while   Diverticulosis 2006   COLONOSCOPY DR. Cleotis Nipper, H/O POLYP   Dizziness    occasionaly   Dyslipidemia    Fatigue    Gastroenteritis and colitis, viral    GERD (gastroesophageal reflux disease)    Hypercholesterolemia    Hyperkalemia    Per the patient, 2013   Hyperlipidemia    Hypertension    Hypertriglyceridemia    Nummular eczema    Osteoarthritis    Pharyngitis    Pityriasis rosea    Pneumonia    Psoriasis    Rash    Rhinosinusitis    Sinusitis    Skin neoplasm    Tinea cruris    Wart      Social History:  Social History   Socioeconomic History   Marital status: Married    Spouse name: Not on file   Number of children: Not on file   Years of education: Not on file   Highest education level: Not on file  Occupational History   Occupation: Full time    Employer: [employer]  Tobacco Use   Smoking status: Every Day    Current packs/day: 0.75    Average packs/day: 0.8 packs/day for 48.0 years (36.0 ttl pk-yrs)    Types: Cigarettes   Smokeless tobacco: Never  Vaping Use   Vaping status: Never Used  Substance and Sexual Activity   Alcohol use: Not Currently   Drug use: No   Sexual activity: Not Currently  Other Topics Concern   Not on file  Social History Narrative   No regular exercise.   Social Determinants of Health   Financial Resource Strain: Not on file  Food Insecurity: Not on file  Transportation Needs: Not on file  Physical Activity: Not on file  Stress: Not on file  Social Connections: Not on file  Intimate Partner Violence: Not on file    Medications:   Current Outpatient Medications on File Prior to Visit  Medication Sig Dispense Refill   albuterol (VENTOLIN HFA) 108 (90 Base) MCG/ACT inhaler Inhale into the lungs.     ALPRAZolam (XANAX) 1 MG tablet Take 1 mg by mouth at bedtime.  2   aspirin EC 81 MG tablet Take 81 mg by mouth daily.     atenolol (TENORMIN) 25 MG tablet Take 25 mg by mouth daily.      atorvastatin (LIPITOR) 10 MG tablet Take 10 mg by mouth at bedtime.       fluticasone furoate-vilanterol (BREO ELLIPTA) 100-25 MCG/ACT AEPB Inhale 1 puff into the lungs daily.     FOLBIC 2.5-25-2 MG TABS Take 1 tablet by mouth Daily.     gemfibrozil (LOPID) 600 MG tablet Take 600 mg by mouth 2 (two) times daily.       montelukast (SINGULAIR) 10 MG tablet Take 10 mg by mouth at bedtime.     Multiple Vitamin (MULTIVITAMIN WITH MINERALS) TABS tablet Take 1 tablet by mouth daily. Centrum Silver for Men 50+     nabumetone (RELAFEN) 500 MG  tablet Take 500 mg by mouth 2 (two) times daily as needed.     pantoprazole (PROTONIX) 40 MG tablet Take 40 mg by mouth at bedtime.      PARoxetine (PAXIL) 40 MG tablet Take 40 mg by mouth at bedtime.      Polyethyl Glycol-Propyl Glycol (SYSTANE ULTRA OP) Place 1 drop into both eyes daily as needed (dry  eyes).     predniSONE (DELTASONE) 20 MG tablet Take by mouth.     ramipril (ALTACE) 2.5 MG capsule Take 2.5 mg by mouth daily.       No current facility-administered medications on file prior to visit.    Allergies:   Allergies  Allergen Reactions   Niacin And Related Other (See Comments)    flushing    Physical Exam General: well developed, well nourished, seated, in no evident distress Head: head normocephalic and atraumatic.   Neck: supple with no carotid or supraclavicular bruits Cardiovascular: regular rate and rhythm, no murmurs Musculoskeletal: no deformity Skin:  no rash/petichiae Vascular:  Normal pulses all extremities  Neurologic Exam Mental Status: Awake and fully alert. Oriented to place and time. Recent and remote memory intact. Attention span, concentration and fund of knowledge appropriate. Mood and affect appropriate.  Diminished recall 1/3.  Able to name 13 animals which can walk on 4 legs.  Clock drawing 4/4.  Mini-Mental status exam score 25/30.  Geriatric depression scale scored 4 not depressed Cranial Nerves: Fundoscopic exam reveals sharp disc margins. Pupils equal, briskly reactive to light. Extraocular movements full without nystagmus. Visual fields full to confrontation. Hearing intact. Facial sensation intact. Face, tongue, palate moves normally and symmetrically.  Motor: Normal bulk and tone. Normal strength in all tested extremity muscles. Sensory.: intact to touch , pinprick , position and vibratory sensation.  Coordination: Rapid alternating movements normal in all extremities. Finger-to-nose and heel-to-shin performed accurately bilaterally. Gait and  Station: Arises from chair without difficulty. Stance is normal. Gait demonstrates normal stride length and balance . Able to heel, toe and tandem walk without difficulty.  Reflexes: 1+ and symmetric. Toes downgoing.       No data to display            NIHSS  0 Modified Rankin  1     ASSESSMENT: 72 year old Caucasian male with recurrent left face and hand paresthesias of unclear etiology.  Possibilities include poststroke paresthesias from right thalamic infarct not visualized on CT scan versus atypical migraine.  Doubt sensory seizures.  He also has memory loss and mild cognitive impairment.     PLAN: I had a long discussion with the patient and his wife regarding his episodes of recurrent left face and hand paresthesias and discussed differential diagnosis between poststroke paresthesias, atypical migraine and simple partial seizures.  He also has mild cognitive impairment.  I recommended treatment trial of Topamax 25 mg twice daily and to increase as tolerated.  Check dementia panel labs, EEG, MRI scan of the brain with and without contrast.  Continue aspirin for stroke prevention and maintain aggressive risk factor modification with strict control of hypertension with blood pressure goal below 140/90, lipids with LDL cholesterol goal below 70 mg percent and diabetes with hemoglobin A1c goal below 6.5%.  I also encouraged him to do cognitively challenging activities like solving crossword puzzles, playing bridge and sudoku.  We also discussed memory compensation strategies.  He will return for follow-up in the future in 3 to 4 months or call earlier if necessary.  Greater than 50% time during this prolonged 60-minute consultation visit was spent on counseling and coordination of care about his episodes of recurrent paresthesias discussion about differential diagnosis, mild cognitive impairment evaluation and treatment plan discussion. Delia Heady, MD  Note: This document was prepared with  digital dictation and possible smart phrase technology. Any transcriptional errors that result from this process are unintentional.

## 2023-04-18 NOTE — Progress Notes (Signed)
Kindly inform the patient that her vitamin B12, thyroid hormone, test for syphilis and homocystine were all normal

## 2023-04-25 ENCOUNTER — Telehealth: Payer: Self-pay

## 2023-04-25 ENCOUNTER — Other Ambulatory Visit (HOSPITAL_COMMUNITY): Payer: Self-pay

## 2023-04-25 DIAGNOSIS — M542 Cervicalgia: Secondary | ICD-10-CM | POA: Diagnosis not present

## 2023-04-25 NOTE — Telephone Encounter (Signed)
Pharmacy Patient Advocate Encounter   Received notification from CoverMyMeds that prior authorization for Topiramate 25MG  tablets is required/requested.   Insurance verification completed.   The patient is insured through  [employer]  .   Per test claim: PA required; PA submitted to [employer] via CoverMyMeds Key/confirmation #/EOC [id number] Status is pending

## 2023-04-26 ENCOUNTER — Ambulatory Visit (INDEPENDENT_AMBULATORY_CARE_PROVIDER_SITE_OTHER): Payer: Medicare Other | Admitting: Neurology

## 2023-04-26 DIAGNOSIS — R413 Other amnesia: Secondary | ICD-10-CM

## 2023-04-26 DIAGNOSIS — R4182 Altered mental status, unspecified: Secondary | ICD-10-CM | POA: Diagnosis not present

## 2023-04-27 DIAGNOSIS — M542 Cervicalgia: Secondary | ICD-10-CM | POA: Diagnosis not present

## 2023-04-30 DIAGNOSIS — M542 Cervicalgia: Secondary | ICD-10-CM | POA: Diagnosis not present

## 2023-05-01 DIAGNOSIS — M542 Cervicalgia: Secondary | ICD-10-CM | POA: Diagnosis not present

## 2023-05-02 ENCOUNTER — Ambulatory Visit
Admission: RE | Admit: 2023-05-02 | Discharge: 2023-05-02 | Disposition: A | Discharge status: Home / Self Care | Payer: Medicare Other | Source: Ambulatory Visit | Attending: Neurology | Admitting: Neurology

## 2023-05-02 DIAGNOSIS — R413 Other amnesia: Secondary | ICD-10-CM | POA: Diagnosis not present

## 2023-05-02 MED ORDER — GADOPICLENOL 0.5 MMOL/ML IV SOLN
8.0000 mL | Freq: Once | INTRAVENOUS | Status: AC | PRN
  Administered 2023-05-02: 8 mL via INTRAVENOUS

## 2023-05-03 ENCOUNTER — Telehealth: Payer: Self-pay | Admitting: Neurology

## 2023-05-03 NOTE — Telephone Encounter (Signed)
Pt's wife called wanting to know when they will be called with the results of the test the pt had including the EEG. Please advise.

## 2023-05-04 ENCOUNTER — Other Ambulatory Visit (HOSPITAL_COMMUNITY): Payer: Self-pay

## 2023-05-04 NOTE — Telephone Encounter (Signed)
Pharmacy Patient Advocate Encounter  Received notification from [employer] that Prior Authorization for Topiramate 25MG  tablets has been APPROVED from 03/26/2023 to 04/24/2024. Ran test claim, Copay is $3.24. This test claim was processed through [facility]- copay amounts may vary at other pharmacies due to pharmacy/plan contracts, or as the patient moves through the different stages of their insurance plan.   PA #/Case ID/Reference #: PA Case ID #: [id number]

## 2023-05-07 ENCOUNTER — Other Ambulatory Visit: Payer: Self-pay | Admitting: Neurology

## 2023-05-07 MED ORDER — LEVETIRACETAM ER 500 MG PO TB24: 500.0000 mg | ORAL_TABLET | Freq: Every day | ORAL | 3 refills | Status: AC

## 2023-05-07 NOTE — Telephone Encounter (Signed)
Dr Pearlean Brownie has not yet reviewed the results officially but upon looking I called the wife to discuss what I could. She is concerned about the MRI the angioma and remote hemorrhagic blood products and what causes that. Advised I would bring their concern to Dr Pearlean Brownie and let them know what his thoughts are in reference to the MRI. The wife is very concerned because they had mentioned the numbness and tingling in law and hand before but Saturday he had a new symptoms where the had that in left jaw/hand and arm and it went down into the left leg. This sensation lasted 5-10 min at the most and then went away but he had never had the numbness and tingling go into the left leg.    EEG: "Normal electroencephalogram, awake, asleep and with activation procedures. There are no focal lateralizing or epileptiform features."   MRI brain:  "MRI scan of the brain with and without contrast showing mild age-related changes of chronic small vessel disease as well as right frontal venous angioma with remote hemorrhagic blood products."

## 2023-05-07 NOTE — Telephone Encounter (Addendum)
Pt's wife calling to get EEG and MRI results. Would like a call from the nurse. Call my husband's phone.

## 2023-05-08 DIAGNOSIS — M542 Cervicalgia: Secondary | ICD-10-CM | POA: Diagnosis not present

## 2023-05-08 NOTE — Telephone Encounter (Signed)
Even though the EEG was normal since MRI shows some old blood on the right side is possible this numbness may be sensory seizures.  I recommend a trial of Keppra XR 500 mg daily for seizure prophylaxis    Called the wife back and advised per Dr Pearlean Brownie, even though the patient's EEG was normal. The MRI's show old blood on right side and this could be causing sensory seizures. Advised that Dr Pearlean Brownie would like to start the pt on ER Keppra once a day medication. She verbalized understanding. Scheduled for a 4 month follow up visit.

## 2023-05-10 DIAGNOSIS — M542 Cervicalgia: Secondary | ICD-10-CM | POA: Diagnosis not present

## 2023-05-14 NOTE — Progress Notes (Signed)
Kindly inform the patient that MRI scan of the brain shows mild age-related changes of hardening of the arteries.  There is 1 small area of remote age bleeding of one of the small vessels on the surface of the brain on the right side.  This is likely an incidental finding.  Nothing to worry about and call me for any questions

## 2023-05-14 NOTE — Progress Notes (Signed)
Kindly inform the patient that brainwave study or EEG was normal.  No seizure activity.

## 2023-05-15 DIAGNOSIS — M542 Cervicalgia: Secondary | ICD-10-CM | POA: Diagnosis not present

## 2023-05-17 DIAGNOSIS — M542 Cervicalgia: Secondary | ICD-10-CM | POA: Diagnosis not present

## 2023-05-20 ENCOUNTER — Other Ambulatory Visit: Payer: Medicare Other

## 2023-05-24 DIAGNOSIS — M542 Cervicalgia: Secondary | ICD-10-CM | POA: Diagnosis not present

## 2023-06-14 DIAGNOSIS — L400 Psoriasis vulgaris: Secondary | ICD-10-CM | POA: Diagnosis not present

## 2023-06-20 DIAGNOSIS — Z0189 Encounter for other specified special examinations: Secondary | ICD-10-CM | POA: Diagnosis not present

## 2023-06-20 DIAGNOSIS — Z125 Encounter for screening for malignant neoplasm of prostate: Secondary | ICD-10-CM | POA: Diagnosis not present

## 2023-06-20 DIAGNOSIS — Z0000 Encounter for general adult medical examination without abnormal findings: Secondary | ICD-10-CM | POA: Diagnosis not present

## 2023-06-20 DIAGNOSIS — T839XXD Unspecified complication of genitourinary prosthetic device, implant and graft, subsequent encounter: Secondary | ICD-10-CM | POA: Diagnosis not present

## 2023-06-20 DIAGNOSIS — E785 Hyperlipidemia, unspecified: Secondary | ICD-10-CM | POA: Diagnosis not present

## 2023-06-27 DIAGNOSIS — F17200 Nicotine dependence, unspecified, uncomplicated: Secondary | ICD-10-CM | POA: Diagnosis not present

## 2023-06-27 DIAGNOSIS — Z0000 Encounter for general adult medical examination without abnormal findings: Secondary | ICD-10-CM | POA: Diagnosis not present

## 2023-06-27 DIAGNOSIS — H8110 Benign paroxysmal vertigo, unspecified ear: Secondary | ICD-10-CM | POA: Diagnosis not present

## 2023-06-27 DIAGNOSIS — I10 Essential (primary) hypertension: Secondary | ICD-10-CM | POA: Diagnosis not present

## 2023-06-27 DIAGNOSIS — M609 Myositis, unspecified: Secondary | ICD-10-CM | POA: Diagnosis not present

## 2023-06-27 DIAGNOSIS — J439 Emphysema, unspecified: Secondary | ICD-10-CM | POA: Diagnosis not present

## 2023-06-27 DIAGNOSIS — F329 Major depressive disorder, single episode, unspecified: Secondary | ICD-10-CM | POA: Diagnosis not present

## 2023-06-27 DIAGNOSIS — I25118 Atherosclerotic heart disease of native coronary artery with other forms of angina pectoris: Secondary | ICD-10-CM | POA: Diagnosis not present

## 2023-06-27 DIAGNOSIS — Z1331 Encounter for screening for depression: Secondary | ICD-10-CM | POA: Diagnosis not present

## 2023-06-27 DIAGNOSIS — Z23 Encounter for immunization: Secondary | ICD-10-CM | POA: Diagnosis not present

## 2023-06-27 DIAGNOSIS — R350 Frequency of micturition: Secondary | ICD-10-CM | POA: Diagnosis not present

## 2023-06-27 DIAGNOSIS — R0609 Other forms of dyspnea: Secondary | ICD-10-CM | POA: Diagnosis not present

## 2023-06-27 DIAGNOSIS — I700 Atherosclerosis of aorta: Secondary | ICD-10-CM | POA: Diagnosis not present

## 2023-06-27 DIAGNOSIS — R413 Other amnesia: Secondary | ICD-10-CM | POA: Diagnosis not present

## 2023-06-27 DIAGNOSIS — E785 Hyperlipidemia, unspecified: Secondary | ICD-10-CM | POA: Diagnosis not present

## 2023-07-18 DIAGNOSIS — J029 Acute pharyngitis, unspecified: Secondary | ICD-10-CM | POA: Diagnosis not present

## 2023-07-18 DIAGNOSIS — R059 Cough, unspecified: Secondary | ICD-10-CM | POA: Diagnosis not present

## 2023-07-18 DIAGNOSIS — J020 Streptococcal pharyngitis: Secondary | ICD-10-CM | POA: Diagnosis not present

## 2023-08-08 DIAGNOSIS — L309 Dermatitis, unspecified: Secondary | ICD-10-CM | POA: Diagnosis not present

## 2023-08-16 ENCOUNTER — Telehealth: Payer: Self-pay | Admitting: Neurology

## 2023-08-16 NOTE — Telephone Encounter (Signed)
Pt's wife called and cancelled appointment due to patient will be out of town.

## 2023-08-23 ENCOUNTER — Ambulatory Visit: Payer: Medicare Other | Admitting: Neurology

## 2023-09-06 DIAGNOSIS — F17200 Nicotine dependence, unspecified, uncomplicated: Secondary | ICD-10-CM | POA: Diagnosis not present

## 2023-09-06 DIAGNOSIS — R5383 Other fatigue: Secondary | ICD-10-CM | POA: Diagnosis not present

## 2023-09-06 DIAGNOSIS — R0602 Shortness of breath: Secondary | ICD-10-CM | POA: Diagnosis not present

## 2023-09-06 DIAGNOSIS — H6991 Unspecified Eustachian tube disorder, right ear: Secondary | ICD-10-CM | POA: Diagnosis not present

## 2023-09-06 DIAGNOSIS — Z1152 Encounter for screening for COVID-19: Secondary | ICD-10-CM | POA: Diagnosis not present

## 2023-09-06 DIAGNOSIS — J019 Acute sinusitis, unspecified: Secondary | ICD-10-CM | POA: Diagnosis not present

## 2023-09-06 DIAGNOSIS — I10 Essential (primary) hypertension: Secondary | ICD-10-CM | POA: Diagnosis not present

## 2023-09-06 DIAGNOSIS — H938X1 Other specified disorders of right ear: Secondary | ICD-10-CM | POA: Diagnosis not present

## 2023-09-06 DIAGNOSIS — R0981 Nasal congestion: Secondary | ICD-10-CM | POA: Diagnosis not present

## 2023-09-10 DIAGNOSIS — H9120 Sudden idiopathic hearing loss, unspecified ear: Secondary | ICD-10-CM | POA: Insufficient documentation

## 2023-09-10 DIAGNOSIS — H903 Sensorineural hearing loss, bilateral: Secondary | ICD-10-CM | POA: Diagnosis not present

## 2023-09-10 DIAGNOSIS — L400 Psoriasis vulgaris: Secondary | ICD-10-CM | POA: Diagnosis not present

## 2023-10-01 DIAGNOSIS — R0602 Shortness of breath: Secondary | ICD-10-CM | POA: Diagnosis not present

## 2023-10-01 DIAGNOSIS — R42 Dizziness and giddiness: Secondary | ICD-10-CM | POA: Diagnosis not present

## 2023-10-01 DIAGNOSIS — F17210 Nicotine dependence, cigarettes, uncomplicated: Secondary | ICD-10-CM | POA: Diagnosis not present

## 2023-10-01 DIAGNOSIS — R531 Weakness: Secondary | ICD-10-CM | POA: Diagnosis not present

## 2023-10-01 DIAGNOSIS — Z79899 Other long term (current) drug therapy: Secondary | ICD-10-CM | POA: Diagnosis not present

## 2023-10-01 DIAGNOSIS — R079 Chest pain, unspecified: Secondary | ICD-10-CM | POA: Diagnosis not present

## 2023-10-01 DIAGNOSIS — Z5329 Procedure and treatment not carried out because of patient's decision for other reasons: Secondary | ICD-10-CM | POA: Diagnosis not present

## 2023-10-01 DIAGNOSIS — R5381 Other malaise: Secondary | ICD-10-CM | POA: Diagnosis not present

## 2023-10-01 DIAGNOSIS — F32A Depression, unspecified: Secondary | ICD-10-CM | POA: Diagnosis not present

## 2023-10-01 DIAGNOSIS — Z03818 Encounter for observation for suspected exposure to other biological agents ruled out: Secondary | ICD-10-CM | POA: Diagnosis not present

## 2023-10-01 DIAGNOSIS — Z743 Need for continuous supervision: Secondary | ICD-10-CM | POA: Diagnosis not present

## 2023-10-01 DIAGNOSIS — R61 Generalized hyperhidrosis: Secondary | ICD-10-CM | POA: Diagnosis not present

## 2023-10-01 DIAGNOSIS — Z888 Allergy status to other drugs, medicaments and biological substances status: Secondary | ICD-10-CM | POA: Diagnosis not present

## 2023-10-03 DIAGNOSIS — G4700 Insomnia, unspecified: Secondary | ICD-10-CM | POA: Diagnosis not present

## 2023-10-03 DIAGNOSIS — R079 Chest pain, unspecified: Secondary | ICD-10-CM | POA: Diagnosis not present

## 2023-10-03 DIAGNOSIS — Z79899 Other long term (current) drug therapy: Secondary | ICD-10-CM | POA: Diagnosis not present

## 2023-10-03 DIAGNOSIS — K219 Gastro-esophageal reflux disease without esophagitis: Secondary | ICD-10-CM | POA: Diagnosis not present

## 2023-10-03 DIAGNOSIS — E7800 Pure hypercholesterolemia, unspecified: Secondary | ICD-10-CM | POA: Diagnosis not present

## 2023-10-03 DIAGNOSIS — F32A Depression, unspecified: Secondary | ICD-10-CM | POA: Diagnosis not present

## 2023-10-03 DIAGNOSIS — G4733 Obstructive sleep apnea (adult) (pediatric): Secondary | ICD-10-CM | POA: Diagnosis not present

## 2023-10-03 DIAGNOSIS — F419 Anxiety disorder, unspecified: Secondary | ICD-10-CM | POA: Diagnosis not present

## 2023-10-03 DIAGNOSIS — G3189 Other specified degenerative diseases of nervous system: Secondary | ICD-10-CM | POA: Diagnosis not present

## 2023-10-03 DIAGNOSIS — Z03818 Encounter for observation for suspected exposure to other biological agents ruled out: Secondary | ICD-10-CM | POA: Diagnosis not present

## 2023-10-03 DIAGNOSIS — R9082 White matter disease, unspecified: Secondary | ICD-10-CM | POA: Diagnosis not present

## 2023-10-03 DIAGNOSIS — I63541 Cerebral infarction due to unspecified occlusion or stenosis of right cerebellar artery: Secondary | ICD-10-CM | POA: Diagnosis not present

## 2023-10-03 DIAGNOSIS — R531 Weakness: Secondary | ICD-10-CM | POA: Diagnosis not present

## 2023-10-04 DIAGNOSIS — G4700 Insomnia, unspecified: Secondary | ICD-10-CM | POA: Diagnosis not present

## 2023-10-04 DIAGNOSIS — R531 Weakness: Secondary | ICD-10-CM | POA: Diagnosis not present

## 2023-10-04 DIAGNOSIS — F32A Depression, unspecified: Secondary | ICD-10-CM | POA: Diagnosis not present

## 2023-10-04 DIAGNOSIS — F419 Anxiety disorder, unspecified: Secondary | ICD-10-CM | POA: Diagnosis not present

## 2023-10-05 DIAGNOSIS — G4700 Insomnia, unspecified: Secondary | ICD-10-CM | POA: Diagnosis not present

## 2023-10-05 DIAGNOSIS — F32A Depression, unspecified: Secondary | ICD-10-CM | POA: Diagnosis not present

## 2023-10-05 DIAGNOSIS — F419 Anxiety disorder, unspecified: Secondary | ICD-10-CM | POA: Diagnosis not present

## 2023-10-06 DIAGNOSIS — F32A Depression, unspecified: Secondary | ICD-10-CM | POA: Diagnosis not present

## 2023-10-06 DIAGNOSIS — G4700 Insomnia, unspecified: Secondary | ICD-10-CM | POA: Diagnosis not present

## 2023-10-06 DIAGNOSIS — F419 Anxiety disorder, unspecified: Secondary | ICD-10-CM | POA: Diagnosis not present

## 2023-10-11 DIAGNOSIS — A419 Sepsis, unspecified organism: Secondary | ICD-10-CM | POA: Diagnosis not present

## 2023-10-11 DIAGNOSIS — I10 Essential (primary) hypertension: Secondary | ICD-10-CM | POA: Diagnosis not present

## 2023-10-11 DIAGNOSIS — R079 Chest pain, unspecified: Secondary | ICD-10-CM | POA: Diagnosis not present

## 2023-10-11 DIAGNOSIS — F419 Anxiety disorder, unspecified: Secondary | ICD-10-CM | POA: Diagnosis not present

## 2023-10-11 DIAGNOSIS — R531 Weakness: Secondary | ICD-10-CM | POA: Diagnosis not present

## 2023-10-11 DIAGNOSIS — E785 Hyperlipidemia, unspecified: Secondary | ICD-10-CM | POA: Diagnosis not present

## 2023-10-11 DIAGNOSIS — I2693 Single subsegmental pulmonary embolism without acute cor pulmonale: Secondary | ICD-10-CM | POA: Diagnosis not present

## 2023-10-11 DIAGNOSIS — Z20822 Contact with and (suspected) exposure to covid-19: Secondary | ICD-10-CM | POA: Diagnosis not present

## 2023-10-11 DIAGNOSIS — Z743 Need for continuous supervision: Secondary | ICD-10-CM | POA: Diagnosis not present

## 2023-10-11 DIAGNOSIS — I2510 Atherosclerotic heart disease of native coronary artery without angina pectoris: Secondary | ICD-10-CM | POA: Diagnosis not present

## 2023-10-11 DIAGNOSIS — I2699 Other pulmonary embolism without acute cor pulmonale: Secondary | ICD-10-CM | POA: Diagnosis not present

## 2023-10-11 DIAGNOSIS — L409 Psoriasis, unspecified: Secondary | ICD-10-CM | POA: Diagnosis not present

## 2023-10-11 DIAGNOSIS — J9811 Atelectasis: Secondary | ICD-10-CM | POA: Diagnosis not present

## 2023-10-11 DIAGNOSIS — R0602 Shortness of breath: Secondary | ICD-10-CM | POA: Diagnosis not present

## 2023-10-11 DIAGNOSIS — J9601 Acute respiratory failure with hypoxia: Secondary | ICD-10-CM | POA: Diagnosis not present

## 2023-10-11 DIAGNOSIS — J189 Pneumonia, unspecified organism: Secondary | ICD-10-CM | POA: Diagnosis not present

## 2023-10-11 DIAGNOSIS — F329 Major depressive disorder, single episode, unspecified: Secondary | ICD-10-CM | POA: Diagnosis not present

## 2023-10-11 DIAGNOSIS — J449 Chronic obstructive pulmonary disease, unspecified: Secondary | ICD-10-CM | POA: Diagnosis not present

## 2023-10-12 DIAGNOSIS — I2699 Other pulmonary embolism without acute cor pulmonale: Secondary | ICD-10-CM | POA: Diagnosis not present

## 2023-10-12 DIAGNOSIS — R011 Cardiac murmur, unspecified: Secondary | ICD-10-CM | POA: Diagnosis not present

## 2023-10-12 DIAGNOSIS — I2690 Septic pulmonary embolism without acute cor pulmonale: Secondary | ICD-10-CM | POA: Diagnosis not present

## 2023-10-13 DIAGNOSIS — I2690 Septic pulmonary embolism without acute cor pulmonale: Secondary | ICD-10-CM | POA: Diagnosis not present

## 2023-10-16 DIAGNOSIS — Z86711 Personal history of pulmonary embolism: Secondary | ICD-10-CM | POA: Diagnosis not present

## 2023-10-16 DIAGNOSIS — G43109 Migraine with aura, not intractable, without status migrainosus: Secondary | ICD-10-CM | POA: Diagnosis not present

## 2023-10-16 DIAGNOSIS — I25118 Atherosclerotic heart disease of native coronary artery with other forms of angina pectoris: Secondary | ICD-10-CM | POA: Diagnosis not present

## 2023-10-16 DIAGNOSIS — M1990 Unspecified osteoarthritis, unspecified site: Secondary | ICD-10-CM | POA: Diagnosis not present

## 2023-10-16 DIAGNOSIS — D6859 Other primary thrombophilia: Secondary | ICD-10-CM | POA: Diagnosis not present

## 2023-10-16 DIAGNOSIS — H8110 Benign paroxysmal vertigo, unspecified ear: Secondary | ICD-10-CM | POA: Diagnosis not present

## 2023-10-16 DIAGNOSIS — I10 Essential (primary) hypertension: Secondary | ICD-10-CM | POA: Diagnosis not present

## 2023-10-16 DIAGNOSIS — E785 Hyperlipidemia, unspecified: Secondary | ICD-10-CM | POA: Diagnosis not present

## 2023-10-16 DIAGNOSIS — R413 Other amnesia: Secondary | ICD-10-CM | POA: Diagnosis not present

## 2023-10-16 DIAGNOSIS — J439 Emphysema, unspecified: Secondary | ICD-10-CM | POA: Diagnosis not present

## 2023-10-16 DIAGNOSIS — F419 Anxiety disorder, unspecified: Secondary | ICD-10-CM | POA: Diagnosis not present

## 2023-10-16 DIAGNOSIS — M609 Myositis, unspecified: Secondary | ICD-10-CM | POA: Diagnosis not present

## 2023-10-20 DIAGNOSIS — Z86718 Personal history of other venous thrombosis and embolism: Secondary | ICD-10-CM | POA: Diagnosis not present

## 2023-10-20 DIAGNOSIS — Z7901 Long term (current) use of anticoagulants: Secondary | ICD-10-CM | POA: Diagnosis not present

## 2023-10-20 DIAGNOSIS — Z743 Need for continuous supervision: Secondary | ICD-10-CM | POA: Diagnosis not present

## 2023-10-20 DIAGNOSIS — R109 Unspecified abdominal pain: Secondary | ICD-10-CM | POA: Diagnosis not present

## 2023-10-20 DIAGNOSIS — I2510 Atherosclerotic heart disease of native coronary artery without angina pectoris: Secondary | ICD-10-CM | POA: Diagnosis not present

## 2023-10-20 DIAGNOSIS — Z955 Presence of coronary angioplasty implant and graft: Secondary | ICD-10-CM | POA: Diagnosis not present

## 2023-10-20 DIAGNOSIS — K921 Melena: Secondary | ICD-10-CM | POA: Diagnosis not present

## 2023-10-20 DIAGNOSIS — R079 Chest pain, unspecified: Secondary | ICD-10-CM | POA: Diagnosis not present

## 2023-10-20 DIAGNOSIS — R531 Weakness: Secondary | ICD-10-CM | POA: Diagnosis not present

## 2023-10-20 DIAGNOSIS — Z881 Allergy status to other antibiotic agents status: Secondary | ICD-10-CM | POA: Diagnosis not present

## 2023-10-20 DIAGNOSIS — Z87891 Personal history of nicotine dependence: Secondary | ICD-10-CM | POA: Diagnosis not present

## 2023-10-20 DIAGNOSIS — I2699 Other pulmonary embolism without acute cor pulmonale: Secondary | ICD-10-CM | POA: Diagnosis not present

## 2023-10-20 DIAGNOSIS — K922 Gastrointestinal hemorrhage, unspecified: Secondary | ICD-10-CM | POA: Diagnosis not present

## 2023-10-21 DIAGNOSIS — K31819 Angiodysplasia of stomach and duodenum without bleeding: Secondary | ICD-10-CM | POA: Diagnosis not present

## 2023-10-21 DIAGNOSIS — R109 Unspecified abdominal pain: Secondary | ICD-10-CM | POA: Diagnosis not present

## 2023-10-21 DIAGNOSIS — I10 Essential (primary) hypertension: Secondary | ICD-10-CM | POA: Diagnosis not present

## 2023-10-21 DIAGNOSIS — Z955 Presence of coronary angioplasty implant and graft: Secondary | ICD-10-CM | POA: Diagnosis not present

## 2023-10-21 DIAGNOSIS — Z86718 Personal history of other venous thrombosis and embolism: Secondary | ICD-10-CM | POA: Diagnosis not present

## 2023-10-21 DIAGNOSIS — K921 Melena: Secondary | ICD-10-CM | POA: Diagnosis not present

## 2023-10-21 DIAGNOSIS — D62 Acute posthemorrhagic anemia: Secondary | ICD-10-CM | POA: Diagnosis not present

## 2023-10-21 DIAGNOSIS — E785 Hyperlipidemia, unspecified: Secondary | ICD-10-CM | POA: Diagnosis not present

## 2023-10-21 DIAGNOSIS — K922 Gastrointestinal hemorrhage, unspecified: Secondary | ICD-10-CM | POA: Diagnosis not present

## 2023-10-21 DIAGNOSIS — L4050 Arthropathic psoriasis, unspecified: Secondary | ICD-10-CM | POA: Diagnosis not present

## 2023-10-21 DIAGNOSIS — K5520 Angiodysplasia of colon without hemorrhage: Secondary | ICD-10-CM | POA: Diagnosis not present

## 2023-10-21 DIAGNOSIS — I2510 Atherosclerotic heart disease of native coronary artery without angina pectoris: Secondary | ICD-10-CM | POA: Diagnosis not present

## 2023-10-21 DIAGNOSIS — Z01810 Encounter for preprocedural cardiovascular examination: Secondary | ICD-10-CM | POA: Diagnosis not present

## 2023-10-21 DIAGNOSIS — Z7901 Long term (current) use of anticoagulants: Secondary | ICD-10-CM | POA: Diagnosis not present

## 2023-10-21 DIAGNOSIS — Z87891 Personal history of nicotine dependence: Secondary | ICD-10-CM | POA: Diagnosis not present

## 2023-10-21 DIAGNOSIS — Z791 Long term (current) use of non-steroidal anti-inflammatories (NSAID): Secondary | ICD-10-CM | POA: Diagnosis not present

## 2023-10-21 DIAGNOSIS — D6832 Hemorrhagic disorder due to extrinsic circulating anticoagulants: Secondary | ICD-10-CM | POA: Diagnosis not present

## 2023-10-21 DIAGNOSIS — K31811 Angiodysplasia of stomach and duodenum with bleeding: Secondary | ICD-10-CM | POA: Diagnosis not present

## 2023-10-21 DIAGNOSIS — J449 Chronic obstructive pulmonary disease, unspecified: Secondary | ICD-10-CM | POA: Diagnosis not present

## 2023-10-21 DIAGNOSIS — I2699 Other pulmonary embolism without acute cor pulmonale: Secondary | ICD-10-CM | POA: Diagnosis not present

## 2023-10-21 DIAGNOSIS — K2289 Other specified disease of esophagus: Secondary | ICD-10-CM | POA: Diagnosis not present

## 2023-10-21 DIAGNOSIS — Z743 Need for continuous supervision: Secondary | ICD-10-CM | POA: Diagnosis not present

## 2023-10-21 DIAGNOSIS — F32A Depression, unspecified: Secondary | ICD-10-CM | POA: Diagnosis not present

## 2023-10-21 DIAGNOSIS — K3189 Other diseases of stomach and duodenum: Secondary | ICD-10-CM | POA: Diagnosis not present

## 2023-10-21 DIAGNOSIS — F419 Anxiety disorder, unspecified: Secondary | ICD-10-CM | POA: Diagnosis not present

## 2023-10-21 DIAGNOSIS — Z881 Allergy status to other antibiotic agents status: Secondary | ICD-10-CM | POA: Diagnosis not present

## 2023-10-21 DIAGNOSIS — J189 Pneumonia, unspecified organism: Secondary | ICD-10-CM | POA: Diagnosis not present

## 2023-11-02 DIAGNOSIS — D500 Iron deficiency anemia secondary to blood loss (chronic): Secondary | ICD-10-CM | POA: Diagnosis not present

## 2023-11-02 DIAGNOSIS — F419 Anxiety disorder, unspecified: Secondary | ICD-10-CM | POA: Diagnosis not present

## 2023-11-02 DIAGNOSIS — I10 Essential (primary) hypertension: Secondary | ICD-10-CM | POA: Diagnosis not present

## 2023-11-02 DIAGNOSIS — K922 Gastrointestinal hemorrhage, unspecified: Secondary | ICD-10-CM | POA: Diagnosis not present

## 2023-11-02 DIAGNOSIS — Z1389 Encounter for screening for other disorder: Secondary | ICD-10-CM | POA: Diagnosis not present

## 2023-11-02 DIAGNOSIS — R0600 Dyspnea, unspecified: Secondary | ICD-10-CM | POA: Diagnosis not present

## 2023-11-02 DIAGNOSIS — Z86711 Personal history of pulmonary embolism: Secondary | ICD-10-CM | POA: Diagnosis not present

## 2023-11-02 DIAGNOSIS — J439 Emphysema, unspecified: Secondary | ICD-10-CM | POA: Diagnosis not present

## 2023-11-02 DIAGNOSIS — I6930 Unspecified sequelae of cerebral infarction: Secondary | ICD-10-CM | POA: Diagnosis not present

## 2023-11-02 DIAGNOSIS — I25118 Atherosclerotic heart disease of native coronary artery with other forms of angina pectoris: Secondary | ICD-10-CM | POA: Diagnosis not present

## 2023-11-02 DIAGNOSIS — J449 Chronic obstructive pulmonary disease, unspecified: Secondary | ICD-10-CM | POA: Diagnosis not present

## 2023-11-02 DIAGNOSIS — R110 Nausea: Secondary | ICD-10-CM | POA: Diagnosis not present

## 2023-11-02 DIAGNOSIS — F329 Major depressive disorder, single episode, unspecified: Secondary | ICD-10-CM | POA: Diagnosis not present

## 2023-11-02 DIAGNOSIS — R5383 Other fatigue: Secondary | ICD-10-CM | POA: Diagnosis not present

## 2023-11-02 DIAGNOSIS — E785 Hyperlipidemia, unspecified: Secondary | ICD-10-CM | POA: Diagnosis not present

## 2023-11-12 DIAGNOSIS — D649 Anemia, unspecified: Secondary | ICD-10-CM | POA: Diagnosis not present

## 2023-11-12 DIAGNOSIS — D6859 Other primary thrombophilia: Secondary | ICD-10-CM | POA: Diagnosis not present

## 2023-11-12 DIAGNOSIS — J449 Chronic obstructive pulmonary disease, unspecified: Secondary | ICD-10-CM | POA: Diagnosis not present

## 2023-11-12 DIAGNOSIS — F17200 Nicotine dependence, unspecified, uncomplicated: Secondary | ICD-10-CM | POA: Diagnosis not present

## 2023-11-12 DIAGNOSIS — F329 Major depressive disorder, single episode, unspecified: Secondary | ICD-10-CM | POA: Diagnosis not present

## 2023-11-12 DIAGNOSIS — I2699 Other pulmonary embolism without acute cor pulmonale: Secondary | ICD-10-CM | POA: Diagnosis not present

## 2023-12-03 DIAGNOSIS — L400 Psoriasis vulgaris: Secondary | ICD-10-CM | POA: Diagnosis not present

## 2023-12-04 DIAGNOSIS — R0781 Pleurodynia: Secondary | ICD-10-CM | POA: Diagnosis not present

## 2023-12-04 DIAGNOSIS — Z86711 Personal history of pulmonary embolism: Secondary | ICD-10-CM | POA: Diagnosis not present

## 2023-12-04 DIAGNOSIS — R0600 Dyspnea, unspecified: Secondary | ICD-10-CM | POA: Diagnosis not present

## 2023-12-04 DIAGNOSIS — I700 Atherosclerosis of aorta: Secondary | ICD-10-CM | POA: Diagnosis not present

## 2023-12-04 DIAGNOSIS — D0410 Carcinoma in situ of skin of unspecified eyelid, including canthus: Secondary | ICD-10-CM | POA: Diagnosis not present

## 2023-12-04 DIAGNOSIS — D500 Iron deficiency anemia secondary to blood loss (chronic): Secondary | ICD-10-CM | POA: Diagnosis not present

## 2023-12-04 DIAGNOSIS — F419 Anxiety disorder, unspecified: Secondary | ICD-10-CM | POA: Diagnosis not present

## 2023-12-04 DIAGNOSIS — K922 Gastrointestinal hemorrhage, unspecified: Secondary | ICD-10-CM | POA: Diagnosis not present

## 2023-12-04 DIAGNOSIS — L409 Psoriasis, unspecified: Secondary | ICD-10-CM | POA: Diagnosis not present

## 2023-12-04 DIAGNOSIS — C44622 Squamous cell carcinoma of skin of right upper limb, including shoulder: Secondary | ICD-10-CM | POA: Diagnosis not present

## 2023-12-04 DIAGNOSIS — D485 Neoplasm of uncertain behavior of skin: Secondary | ICD-10-CM | POA: Diagnosis not present

## 2023-12-04 DIAGNOSIS — L570 Actinic keratosis: Secondary | ICD-10-CM | POA: Diagnosis not present

## 2023-12-04 DIAGNOSIS — J449 Chronic obstructive pulmonary disease, unspecified: Secondary | ICD-10-CM | POA: Diagnosis not present

## 2023-12-04 DIAGNOSIS — F329 Major depressive disorder, single episode, unspecified: Secondary | ICD-10-CM | POA: Diagnosis not present

## 2023-12-04 DIAGNOSIS — D6859 Other primary thrombophilia: Secondary | ICD-10-CM | POA: Diagnosis not present

## 2023-12-11 ENCOUNTER — Inpatient Hospital Stay

## 2023-12-11 ENCOUNTER — Inpatient Hospital Stay
Admission: RE | Admit: 2023-12-11 | Discharge: 2023-12-11 | Disposition: A | Discharge status: Home / Self Care | Payer: Self-pay | Source: Ambulatory Visit | Attending: Hematology | Admitting: Hematology

## 2023-12-11 ENCOUNTER — Other Ambulatory Visit

## 2023-12-11 ENCOUNTER — Inpatient Hospital Stay: Attending: Hematology | Admitting: Hematology

## 2023-12-11 ENCOUNTER — Other Ambulatory Visit: Payer: Self-pay

## 2023-12-11 VITALS — BP 121/73 | HR 69 | Temp 98.1°F | Resp 17 | Ht 66.5 in | Wt 167.8 lb

## 2023-12-11 DIAGNOSIS — D6859 Other primary thrombophilia: Secondary | ICD-10-CM

## 2023-12-11 DIAGNOSIS — I2699 Other pulmonary embolism without acute cor pulmonale: Secondary | ICD-10-CM | POA: Insufficient documentation

## 2023-12-11 DIAGNOSIS — Z87891 Personal history of nicotine dependence: Secondary | ICD-10-CM | POA: Diagnosis not present

## 2023-12-11 DIAGNOSIS — Z7901 Long term (current) use of anticoagulants: Secondary | ICD-10-CM | POA: Insufficient documentation

## 2023-12-11 DIAGNOSIS — L409 Psoriasis, unspecified: Secondary | ICD-10-CM | POA: Diagnosis not present

## 2023-12-11 DIAGNOSIS — J440 Chronic obstructive pulmonary disease with acute lower respiratory infection: Secondary | ICD-10-CM | POA: Insufficient documentation

## 2023-12-11 LAB — CBC WITH DIFFERENTIAL (CANCER CENTER ONLY)
Abs Immature Granulocytes: 0.02 10*3/uL (ref 0.00–0.07)
Basophils Absolute: 0 10*3/uL (ref 0.0–0.1)
Basophils Relative: 1 %
Eosinophils Absolute: 0.4 10*3/uL (ref 0.0–0.5)
Eosinophils Relative: 6 %
HCT: 38.1 % — ABNORMAL LOW (ref 39.0–52.0)
Hemoglobin: 12.7 g/dL — ABNORMAL LOW (ref 13.0–17.0)
Immature Granulocytes: 0 %
Lymphocytes Relative: 27 %
Lymphs Abs: 1.8 10*3/uL (ref 0.7–4.0)
MCH: 31.3 pg (ref 26.0–34.0)
MCHC: 33.3 g/dL (ref 30.0–36.0)
MCV: 93.8 fL (ref 80.0–100.0)
Monocytes Absolute: 0.5 10*3/uL (ref 0.1–1.0)
Monocytes Relative: 7 %
Neutro Abs: 3.9 10*3/uL (ref 1.7–7.7)
Neutrophils Relative %: 59 %
Platelet Count: 177 10*3/uL (ref 150–400)
RBC: 4.06 MIL/uL — ABNORMAL LOW (ref 4.22–5.81)
RDW: 12.1 % (ref 11.5–15.5)
WBC Count: 6.6 10*3/uL (ref 4.0–10.5)
nRBC: 0 % (ref 0.0–0.2)

## 2023-12-11 LAB — CMP (CANCER CENTER ONLY)
ALT: 11 U/L (ref 0–44)
AST: 14 U/L — ABNORMAL LOW (ref 15–41)
Albumin: 4.5 g/dL (ref 3.5–5.0)
Alkaline Phosphatase: 40 U/L (ref 38–126)
Anion gap: 5 (ref 5–15)
BUN: 10 mg/dL (ref 8–23)
CO2: 31 mmol/L (ref 22–32)
Calcium: 9.5 mg/dL (ref 8.9–10.3)
Chloride: 106 mmol/L (ref 98–111)
Creatinine: 0.84 mg/dL (ref 0.61–1.24)
GFR, Estimated: >60 mL/min (ref >60)
Glucose, Bld: 97 mg/dL (ref 70–99)
Potassium: 3.5 mmol/L (ref 3.5–5.1)
Sodium: 142 mmol/L (ref 135–145)
Total Bilirubin: 0.5 mg/dL (ref 0.0–1.2)
Total Protein: 6.9 g/dL (ref 6.5–8.1)

## 2023-12-11 LAB — LACTATE DEHYDROGENASE: LDH: 140 U/L (ref 98–192)

## 2023-12-11 LAB — ANTITHROMBIN III: AntiThromb III Func: 102 % (ref 75–120)

## 2023-12-11 NOTE — Progress Notes (Signed)
 HEMATOLOGY/ONCOLOGY CONSULTATION NOTE  Date of Service: 12/11/2023  Patient Care Team: Ronney Lion, NP as PCP - General (Adult Health Nurse Practitioner) Wendall Stade, MD as PCP - Cardiology (Cardiology)  CHIEF COMPLAINTS/PURPOSE OF CONSULTATION:  Pulmonary Embolism and Anemia  HISTORY OF PRESENTING ILLNESS:   Clayton Braun is a wonderful 73 y.o. male who has been referred to [place] by NP Ronney Lion for evaluation and management of Pulmonary Embolism and Anemia. Currently taking Eliquis.   Patient is accompanied by her wife during this visit. Patient notes he had CT scan, X-Ray, and [place] bilateral leg which showed Pulmonary Embolism in [place], [place], around one month ago. We do not have the results and will contact PCP.   Patient notes that his symptoms when he was diagnosed with PE were lower chest pain when laying in bed which radiated to his back and SOB. He did not have bilateral leg swelling at the time of PE. He denies multiple clots.   Patient notes he quit smoking cigarettes in January 2025. He was smoking around one pack a day.   He denies any recent long-distance travel. However, patient's wife notes that the patient used to seat on his chair for a long-period of time right before having PE.   Patient notes he had COPD exacerbation around Mid-January. He was diagnosed with COPD in 2024. He was prescribed Prednisone due to COPD exacerbation and was taking Prednisone 25 mg daily for 2-3 weeks. He discontinued Prednisone around 1-2 weeks before getting diagnosed with PE. Patient notes he was depressed when he was taking Prednisone. He denies being depressed or anxious during this visit.   He denies any recent surgeries or recent lower or upper extremity injuries. He has had surgeries in the past, which did not cause blood clots.   He denies FmHx of hereditary causes of blood clots.   He denies any new infection issues, fever, chills, night sweats, unexpected weight  loss, back pain, chest pain, abdominal pain. During this visit, he does complain of bilateral leg swelling, mostly on the left leg.   During this visit, he denies chest pain and notes th  Patient was taking aspirin at the time of PE.   He denies diet changes or appetite loss.   He denies recent COVID-19 infection.   Patient has PmHx of psoriasis. He has been receiving ilumya injection every 25-months. He started Ilumya treatment around 6-8 months before his PE.   MEDICAL HISTORY:  Past Medical History:  Diagnosis Date   Abdominal pain    Acute respiratory disease    Allergic rhinitis    Allergies    Anxiety    09/09/18- not in a long time   AP (abdominal pain)    Blood in stool    Body mass index 27.0-27.9, adult    Bradycardia, sinus    Bronchitis    Candidiasis    ORAL   Chest pain    March, 2013   Coronary artery disease    Stent to RCA, 2002   /    cath 2009, stent patent, residual 50% LAD   Depression    09/09/2018- not in a while   Diverticulosis 2006   COLONOSCOPY DR. Cleotis Nipper, H/O POLYP   Dizziness    occasionaly   Dyslipidemia    Fatigue    Gastroenteritis and colitis, viral    GERD (gastroesophageal reflux disease)    Hypercholesterolemia    Hyperkalemia    Per the patient, 2013  Hyperlipidemia    Hypertension    Hypertriglyceridemia    Nummular eczema    Osteoarthritis    Pharyngitis    Pityriasis rosea    Pneumonia    Psoriasis    Rash    Rhinosinusitis    Sinusitis    Skin neoplasm    Tinea cruris    Wart     SURGICAL HISTORY: Past Surgical History:  Procedure Laterality Date   APPENDECTOMY     CARDIAC CATHETERIZATION     2002   COLONOSCOPY     ESOPHAGOGASTRODUODENOSCOPY     EYE SURGERY     implants, cataracts   HERNIA REPAIR Right    Inguinal   LEFT HEART CATH AND CORONARY ANGIOGRAPHY N/A 01/05/2020   Procedure: LEFT HEART CATH AND CORONARY ANGIOGRAPHY;  Surgeon: Kathleene Hazel, MD;  Location: [facility];   Service: Cardiovascular;  Laterality: N/A;   LUMBAR LAMINECTOMY/DECOMPRESSION MICRODISCECTOMY Left 03/22/2018   Procedure: Left Lumbar four-five Microdiscectomy;  Surgeon: Maeola Harman, MD;  Location: [facility] OR;  Service: Neurosurgery;  Laterality: Left;   LUMBAR LAMINECTOMY/DECOMPRESSION MICRODISCECTOMY Left 09/10/2018   Procedure: Redo Left Lumbar four-five Microdiscectomy;  Surgeon: Maeola Harman, MD;  Location: [facility] OR;  Service: Neurosurgery;  Laterality: Left;   NASAL SINUS SURGERY     PENILE PROSTHESIS PLACEMENT      SOCIAL HISTORY: Social History   Socioeconomic History   Marital status: Married    Spouse name: Not on file   Number of children: Not on file   Years of education: Not on file   Highest education level: Not on file  Occupational History   Occupation: Full time    Employer: [employer]  Tobacco Use   Smoking status: Every Day    Current packs/day: 0.75    Average packs/day: 0.8 packs/day for 48.0 years (36.0 ttl pk-yrs)    Types: Cigarettes   Smokeless tobacco: Never  Vaping Use   Vaping status: Never Used  Substance and Sexual Activity   Alcohol use: Not Currently   Drug use: No   Sexual activity: Not Currently  Other Topics Concern   Not on file  Social History Narrative   No regular exercise.   Social Drivers of Corporate investment banker Strain: Not on file  Food Insecurity: Not on file  Transportation Needs: Not on file  Physical Activity: Not on file  Stress: Not on file  Social Connections: Not on file  Intimate Partner Violence: Not on file    FAMILY HISTORY: Family History  Problem Relation Age of Onset   Lung disease Other    Heart disease Other    Heart attack Mother    COPD Father     ALLERGIES:  is allergic to niacin and related.  MEDICATIONS:  Current Outpatient Medications  Medication Sig Dispense Refill   albuterol (VENTOLIN HFA) 108 (90 Base) MCG/ACT inhaler Inhale into the lungs.     ALPRAZolam (XANAX) 1 MG tablet  Take 1 mg by mouth at bedtime.  2   aspirin EC 81 MG tablet Take 81 mg by mouth daily.     atenolol (TENORMIN) 25 MG tablet Take 25 mg by mouth daily.      atorvastatin (LIPITOR) 10 MG tablet Take 10 mg by mouth at bedtime.       fluticasone furoate-vilanterol (BREO ELLIPTA) 100-25 MCG/ACT AEPB Inhale 1 puff into the lungs daily.     FOLBIC 2.5-25-2 MG TABS Take 1 tablet by mouth Daily.  gemfibrozil (LOPID) 600 MG tablet Take 600 mg by mouth 2 (two) times daily.       levETIRAcetam (KEPPRA XR) 500 MG 24 hr tablet Take 1 tablet (500 mg total) by mouth daily. 30 tablet 3   montelukast (SINGULAIR) 10 MG tablet Take 10 mg by mouth at bedtime.     Multiple Vitamin (MULTIVITAMIN WITH MINERALS) TABS tablet Take 1 tablet by mouth daily. Centrum Silver for Men 50+     nabumetone (RELAFEN) 500 MG tablet Take 500 mg by mouth 2 (two) times daily as needed.     pantoprazole (PROTONIX) 40 MG tablet Take 40 mg by mouth at bedtime.      PARoxetine (PAXIL) 40 MG tablet Take 40 mg by mouth at bedtime.      Polyethyl Glycol-Propyl Glycol (SYSTANE ULTRA OP) Place 1 drop into both eyes daily as needed (dry eyes).     predniSONE (DELTASONE) 20 MG tablet Take by mouth.     ramipril (ALTACE) 2.5 MG capsule Take 2.5 mg by mouth daily.       topiramate (TOPAMAX) 25 MG tablet Take 1 tablet (25 mg total) by mouth 2 (two) times daily. 120 tablet 3   No current facility-administered medications for this visit.    REVIEW OF SYSTEMS:    10 Point review of Systems was done is negative except as noted above.  PHYSICAL EXAMINATION: ECOG PERFORMANCE STATUS: 1 - Symptomatic but completely ambulatory  . Vitals:   12/11/23 1105  BP: 121/73  Pulse: 69  Resp: 17  Temp: 98.1 F (36.7 C)  SpO2: 96%   Filed Weights   12/11/23 1105  Weight: 167 lb 12.8 oz (76.1 kg)   .Body mass index is 26.68 kg/m.  GENERAL:alert, in no acute distress and comfortable SKIN: no acute rashes, no significant lesions EYES:  conjunctiva are pink and non-injected, sclera anicteric OROPHARYNX: MMM, no exudates, no oropharyngeal erythema or ulceration NECK: supple, no JVD LYMPH:  no palpable lymphadenopathy in the cervical, axillary or inguinal regions LUNGS: clear to auscultation b/l with normal respiratory effort HEART: regular rate & rhythm ABDOMEN:  normoactive bowel sounds , non tender, not distended. Extremity: no pedal edema PSYCH: alert & oriented x 3 with fluent speech NEURO: no focal motor/sensory deficits  LABORATORY DATA:  I have reviewed the data as listed  .    Latest Ref Rng & Units 12/11/2023   12:59 PM 12/30/2019   10:33 AM 09/07/2018    2:48 PM  CBC  WBC 4.0 - 10.5 K/uL 6.6  6.9  8.8   Hemoglobin 13.0 - 17.0 g/dL 16.1  09.6  04.5   Hematocrit 39.0 - 52.0 % 38.1  42.9  43.0   Platelets 150 - 400 K/uL 177  205  221     .    Latest Ref Rng & Units 12/11/2023   12:59 PM 12/30/2019   10:33 AM 09/07/2018    2:48 PM  CMP  Glucose 70 - 99 mg/dL 97  409  83   BUN 8 - 23 mg/dL 10  18  23    Creatinine 0.61 - 1.24 mg/dL 8.11  9.14  7.82   Sodium 135 - 145 mmol/L 142  135  137   Potassium 3.5 - 5.1 mmol/L 3.5  4.1  4.1   Chloride 98 - 111 mmol/L 106  102  105   CO2 22 - 32 mmol/L 31  29  24    Calcium 8.9 - 10.3 mg/dL 9.5  95.6  9.9  Total Protein 6.5 - 8.1 g/dL 6.9     Total Bilirubin 0.0 - 1.2 mg/dL 0.5     Alkaline Phos 38 - 126 U/L 40     AST 15 - 41 U/L 14     ALT 0 - 44 U/L 11        RADIOGRAPHIC STUDIES: I have personally reviewed the radiological images as listed and agreed with the findings in the report. No results found.  ASSESSMENT & PLAN:  COPD Pulmonary Embolism. Currently taking Eliquis.  Had CT scan, X-Ray, and [place] of bilateral lower extremities in [place], [place]. Waiting on results since we do not have access to them.   PLAN: -Discussed with the patient that smoking cigarettes and immobility are risk factors for blood clots.  -Discussed with the patient the  difference between provoked and unprovoked PE. Discussed the different treatment for pt with unprovoked vs provoked PE.  -Discussed with the patient that the next plan is to get additional lab work-up to check if this was a provoked or unprovoked PE.  -Continue Eliquis for now.  -Eliquis duration will depend upon lab results. If it is provoked PE, Eliquis will be 8-months. If it is unprovoked PE, Eliquis will most-likely be long-term.  -Recommend to use compression socks.  -Answered all of patient's questions.  -Discussed with the patient that certain medications can increase the risk of blood clots. Discussed that there is possibility that his Ilumya treatment for Psoriasis could be an additional risk factor for blood clots.  -Recommend to get in-touch with his Dermatologist to discuss the risk factors of Ilumya treatment and let them know about new PE.  -Answered all of patient's questions.   . Orders Placed This Encounter  Procedures   CBC with Differential (Cancer Center Only)    Standing Status:   Future    Number of Occurrences:   1    Expected Date:   12/11/2023    Expiration Date:   12/10/2024   CMP (Cancer Center only)    Standing Status:   Future    Number of Occurrences:   1    Expected Date:   12/11/2023    Expiration Date:   12/10/2024   Lactate dehydrogenase    Standing Status:   Future    Number of Occurrences:   1    Expected Date:   12/11/2023    Expiration Date:   12/10/2024   Antithrombin III    Standing Status:   Future    Number of Occurrences:   1    Expected Date:   12/11/2023    Expiration Date:   12/10/2024   Protein C activity    Standing Status:   Future    Number of Occurrences:   1    Expected Date:   12/11/2023    Expiration Date:   12/10/2024   Protein C, total    Standing Status:   Future    Number of Occurrences:   1    Expected Date:   12/11/2023    Expiration Date:   12/10/2024   Protein S activity    Standing Status:   Future    Number of Occurrences:   1     Expected Date:   12/11/2023    Expiration Date:   12/10/2024   Protein S, total    Standing Status:   Future    Number of Occurrences:   1    Expected Date:   12/11/2023  Expiration Date:   12/10/2024   Lupus anticoagulant panel    Standing Status:   Future    Number of Occurrences:   1    Expected Date:   12/11/2023    Expiration Date:   12/10/2024    Remote health to draw?:   No   Beta-2-glycoprotein i abs, IgG/M/A    Standing Status:   Future    Number of Occurrences:   1    Expected Date:   12/11/2023    Expiration Date:   12/10/2024   Factor 5 leiden    Standing Status:   Future    Number of Occurrences:   1    Expected Date:   12/11/2023    Expiration Date:   12/10/2024   Prothrombin gene mutation    Standing Status:   Future    Number of Occurrences:   1    Expected Date:   12/11/2023    Expiration Date:   12/10/2024   Cardiolipin antibodies, IgG, IgM, IgA    Standing Status:   Future    Number of Occurrences:   1    Expected Date:   12/11/2023    Expiration Date:   12/10/2024    FOLLOW-UP: Labs today Phone visit with Dr Candise Che in 3 weeks  .The total time spent in the appointment was 60 minutes* .  All of the patient's questions were answered with apparent satisfaction. The patient knows to call the clinic with any problems, questions or concerns.   Wyvonnia Lora MD MS AAHIVMS [facility] Hematology/Oncology Physician [facility]  .*Total Encounter Time as defined by the Centers for Medicare and Medicaid Services includes, in addition to the face-to-face time of a patient visit (documented in the note above) non-face-to-face time: obtaining and reviewing outside history, ordering and reviewing medications, tests or procedures, care coordination (communications with other health care professionals or caregivers) and documentation in the medical record.  12/11/2023 9:22 AM    I,Param Shah,acting as a scribe for Wyvonnia Lora, MD.,have documented all relevant documentation on the  behalf of Wyvonnia Lora, MD,as directed by  Wyvonnia Lora, MD while in the presence of Wyvonnia Lora, MD.  .I have reviewed the above documentation for accuracy and completeness, and I agree with the above. Johney Maine MD

## 2023-12-12 ENCOUNTER — Telehealth: Payer: Self-pay | Admitting: Hematology

## 2023-12-12 LAB — LUPUS ANTICOAGULANT PANEL
DRVVT: 44.8 s (ref 0.0–47.0)
PTT Lupus Anticoagulant: 37.7 s (ref 0.0–43.5)

## 2023-12-12 LAB — PROTEIN S, TOTAL: Protein S Ag, Total: 67 % (ref 60–150)

## 2023-12-12 LAB — BETA-2-GLYCOPROTEIN I ABS, IGG/M/A
Beta-2 Glyco I IgG: <9 GPI IgG units (ref 0–20)
Beta-2-Glycoprotein I IgA: <9 GPI IgA units (ref 0–25)
Beta-2-Glycoprotein I IgM: <9 GPI IgM units (ref 0–32)

## 2023-12-12 LAB — PROTEIN S ACTIVITY: Protein S Activity: 99 % (ref 63–140)

## 2023-12-12 LAB — PROTEIN C ACTIVITY: Protein C Activity: 115 % (ref 73–180)

## 2023-12-12 NOTE — Telephone Encounter (Signed)
 Spoke with patient confirming upcoming appointment

## 2023-12-13 LAB — CARDIOLIPIN ANTIBODIES, IGG, IGM, IGA
Anticardiolipin IgA: <9 U/mL (ref 0–11)
Anticardiolipin IgG: <9 GPL U/mL (ref 0–14)
Anticardiolipin IgM: <9 [MPL'U]/mL (ref 0–12)

## 2023-12-14 LAB — PROTEIN C, TOTAL: Protein C, Total: 106 % (ref 60–150)

## 2023-12-16 NOTE — Progress Notes (Unsigned)
 Clayton Braun, male    DOB: 05/03/1951    MRN: [id number]   Brief patient profile:  64  yowm  quit smoking jan 2025  referred to pulmonary clinic in [place]  12/18/2023 by [facility]  for copd / pe dx [place] 11/2023    [place] eval 06/2022 for  ? COPD  rec pfts not done as of 12/18/2023      History of Present Illness  12/18/2023  Pulmonary/ 1st office eval/ [facility] / [place] [facility]  Chief Complaint  Patient presents with   Establish Care  Dyspnea:  held back more by leg/back than breathing. Cough: none  Sleep: ok flat bed one pillow  SABA use: none - uses breo prn  02: none     No obvious day to day or daytime pattern/variability or assoc excess/ purulent sputum or mucus plugs or hemoptysis or cp or chest tightness, subjective wheeze or overt sinus or hb symptoms.    Also denies any obvious fluctuation of symptoms with weather or environmental changes or other aggravating or alleviating factors except as outlined above   No unusual exposure hx or h/o childhood pna/ asthma or knowledge of premature birth.  Current Allergies, Complete Past Medical History, Past Surgical History, Family History, and Social History were reviewed in [employer] record.  ROS  The following are not active complaints unless bolded Hoarseness, sore throat, dysphagia, dental problems, itching, sneezing,  nasal congestion or discharge of excess mucus or purulent secretions, ear ache,   fever, chills, sweats, unintended wt loss or wt gain, classically pleuritic or exertional cp,  orthopnea pnd or arm/hand swelling  or leg swelling, presyncope, palpitations, abdominal pain, anorexia, nausea, vomiting, diarrhea  or change in bowel habits or change in bladder habits, change in stools or change in urine, dysuria, hematuria,  rash, arthralgias, visual complaints, headache, numbness, weakness or ataxia or problems with walking or coordination,  change in mood or  memory.             Outpatient Medications Prior to Visit  Medication Sig Dispense Refill   ALPRAZolam (XANAX) 1 MG tablet Take 1 mg by mouth at bedtime.  2   apixaban (ELIQUIS) 5 MG TABS tablet Take 5 mg by mouth 2 (two) times daily.     atenolol (TENORMIN) 25 MG tablet Take 25 mg by mouth daily.     atorvastatin (LIPITOR) 10 MG tablet Take 10 mg by mouth at bedtime.       fluticasone furoate-vilanterol (BREO ELLIPTA) 100-25 MCG/ACT AEPB Inhale 1 puff into the lungs daily.     gemfibrozil (LOPID) 600 MG tablet Take 600 mg by mouth 2 (two) times daily.       montelukast (SINGULAIR) 10 MG tablet Take 10 mg by mouth at bedtime.     Multiple Vitamin (MULTIVITAMIN WITH MINERALS) TABS tablet Take 1 tablet by mouth daily. Centrum Silver for Men 50+     pantoprazole (PROTONIX) 40 MG tablet Take 40 mg by mouth at bedtime.      PARoxetine (PAXIL) 40 MG tablet Take 40 mg by mouth at bedtime.                    topiramate (TOPAMAX) 25 MG tablet Take 1 tablet (25 mg total) by mouth 2 (two) times daily. 120 tablet 3   albuterol (VENTOLIN HFA) 108 (90 Base) MCG/ACT inhaler Inhale into the lungs. (Patient not taking: Reported on 12/18/2023)     aspirin EC 81 MG tablet  Take 81 mg by mouth daily. (Patient not taking: Reported on 12/18/2023)     FEROSUL 325 (65 Fe) MG tablet Take 325 mg by mouth daily. (Patient not taking: Reported on 12/18/2023)     FOLBIC 2.5-25-2 MG TABS Take 1 tablet by mouth Daily. (Patient not taking: Reported on 12/18/2023)     levETIRAcetam (KEPPRA XR) 500 MG 24 hr tablet Take 1 tablet (500 mg total) by mouth daily. (Patient not taking: Reported on 12/18/2023) 30 tablet 3   nabumetone (RELAFEN) 500 MG tablet Take 500 mg by mouth 2 (two) times daily as needed. (Patient not taking: Reported on 12/18/2023)     Polyethyl Glycol-Propyl Glycol (SYSTANE ULTRA OP) Place 1 drop into both eyes daily as needed (dry eyes). (Patient not taking: Reported on 12/18/2023)     traZODone (DESYREL) 50 MG tablet Take 50 mg by mouth at  bedtime. (Patient not taking: Reported on 12/18/2023)     triamcinolone cream (KENALOG) 0.1 % Apply 1 Application topically 3 (three) times daily. (Patient not taking: Reported on 12/18/2023)     No facility-administered medications prior to visit.    Past Medical History:  Diagnosis Date   Abdominal pain    Acute respiratory disease    Allergic rhinitis    Allergies    Anxiety    09/09/18- not in a long time   AP (abdominal pain)    Blood in stool    Body mass index 27.0-27.9, adult    Bradycardia, sinus    Bronchitis    Candidiasis    ORAL   Chest pain    March, 2013   Coronary artery disease    Stent to RCA, 2002   /    cath 2009, stent patent, residual 50% LAD   Depression    09/09/2018- not in a while   Diverticulosis 2006   COLONOSCOPY DR. Cleotis Nipper, H/O POLYP   Dizziness    occasionaly   Dyslipidemia    Fatigue    Gastroenteritis and colitis, viral    GERD (gastroesophageal reflux disease)    Hypercholesterolemia    Hyperkalemia    Per the patient, 2013   Hyperlipidemia    Hypertension    Hypertriglyceridemia    Nummular eczema    Osteoarthritis    Pharyngitis    Pityriasis rosea    Pneumonia    Psoriasis    Rash    Rhinosinusitis    Sinusitis    Skin neoplasm    Tinea cruris    Wart       Objective:     BP 122/62   Pulse 68   Ht 5\' 6"  (1.676 m)   Wt 167 lb 4 oz (75.9 kg)   SpO2 94%   BMI 26.99 kg/m   SpO2: 94 % RA  Very pleasant stoic amb wm nad    HEENT : Oropharynx  clear   Nasal turbinates nl    NECK :  without  apparent JVD/ palpable Nodes/TM    LUNGS: no acc muscle use,  Min barrel  contour chest wall with bilateral  slightly decreased bs s audible wheeze and  without cough on insp or exp maneuvers and min  Hyperresonant  to  percussion bilaterally    CV:  RRR  no s3 or murmur or increase in P2, and no edema   ABD:  soft and nontender with pos end  insp Hoover's  in the supine position.  No bruits or organomegaly appreciated    MS:  Nl  gait/ ext warm without deformities Or obvious joint restrictions  calf tenderness, cyanosis or clubbing     SKIN: warm and dry without lesions    NEURO:  alert, approp, nl sensorium with  no motor or cerebellar deficits apparent.            Assessment   Dyspnea on exertion Quit smoking 09/2023 - LDSCT   Mild centrilobular and paraseptal emphysema with mild diffuse bronchial wall thickening  01/20/20  - "aecopd" while in [place] 2023  - PE in [place] 11/2023 - hypercoagulability profile Kale  12/11/23 >>> - 12/18/2023   Walked on RA  x  3  lap(s) =  approx 450  ft  @ mod pace, stopped due to end of study s cp/ sob with lowest 02 sats 93%   He has mild copd clinically now that no longer on ACEi  =  Not limited by breathing from desired activities  and having no more aecopd so could just use saba prn at this point but has been using BREO 100 one pff p exertion.  Suggested he use up the BREO by taking it more regularly but always at least an hour before he exerts and return with pfts is they suggest /confirm mild copd with group A risk/symptom scoring or not.   PE rx and f/u per Hematology planned   Each maintenance medication was reviewed in detail including emphasizing most importantly the difference between maintenance and prns and under what circumstances the prns are to be triggered using an action plan format where appropriate.  Total time for H and P, chart review, counseling, reviewing dpi device(s) , directly observing portions of ambulatory 02 saturation study/ and generating customized AVS unique to this office visit / same day charting = 45 min with pt new to me                  Sandrea Hughs, MD 12/18/2023

## 2023-12-17 LAB — FACTOR 5 LEIDEN

## 2023-12-17 LAB — PROTHROMBIN GENE MUTATION

## 2023-12-18 ENCOUNTER — Encounter: Payer: Self-pay | Admitting: Internal Medicine

## 2023-12-18 ENCOUNTER — Ambulatory Visit: Admitting: Internal Medicine

## 2023-12-18 VITALS — BP 122/62 | HR 68 | Ht 66.0 in | Wt 167.2 lb

## 2023-12-18 DIAGNOSIS — K219 Gastro-esophageal reflux disease without esophagitis: Secondary | ICD-10-CM | POA: Insufficient documentation

## 2023-12-18 DIAGNOSIS — R0609 Other forms of dyspnea: Secondary | ICD-10-CM | POA: Diagnosis not present

## 2023-12-18 NOTE — Assessment & Plan Note (Addendum)
 Quit smoking 09/2023 - LDSCT   Mild centrilobular and paraseptal emphysema with mild diffuse bronchial wall thickening  01/20/20  - "aecopd" while in [place] 2023  - PE in [place] 11/2023 - hypercoagulability profile Kale  12/11/23 >>> - 12/18/2023   Walked on RA  x  3  lap(s) =  approx 450  ft  @ mod pace, stopped due to end of study s cp/ sob with lowest 02 sats 93%   He has mild copd clinically now that no longer on ACEi  =  Not limited by breathing from desired activities  and having no more aecopd so could just use saba prn at this point but has been using BREO 100 one pff p exertion.  Suggested he use up the BREO by taking it more regularly but always at least an hour before he exerts and return with pfts is they suggest /confirm mild copd with group A risk/symptom scoring or not.   PE rx and f/u per Hematology planned   Each maintenance medication was reviewed in detail including emphasizing most importantly the difference between maintenance and prns and under what circumstances the prns are to be triggered using an action plan format where appropriate.  Total time for H and P, chart review, counseling, reviewing dpi device(s) , directly observing portions of ambulatory 02 saturation study/ and generating customized AVS unique to this office visit / same day charting = 45 min with pt new to me

## 2023-12-18 NOTE — Patient Instructions (Addendum)
 If you want to use the BREO it should be a least an hour before activity and should last all day (take every other day to see if you can tell the difference - try not to take it after your exert and side down because your breathing will improve just from sitting down.   Please schedule a follow up visit in 3 months but call sooner if needed with i

## 2023-12-19 DIAGNOSIS — C44121 Squamous cell carcinoma of skin of unspecified eyelid, including canthus: Secondary | ICD-10-CM | POA: Diagnosis not present

## 2023-12-19 DIAGNOSIS — D485 Neoplasm of uncertain behavior of skin: Secondary | ICD-10-CM | POA: Diagnosis not present

## 2024-01-02 DIAGNOSIS — C44622 Squamous cell carcinoma of skin of right upper limb, including shoulder: Secondary | ICD-10-CM | POA: Diagnosis not present

## 2024-01-03 DIAGNOSIS — I618 Other nontraumatic intracerebral hemorrhage: Secondary | ICD-10-CM | POA: Diagnosis not present

## 2024-01-03 DIAGNOSIS — I6789 Other cerebrovascular disease: Secondary | ICD-10-CM | POA: Diagnosis not present

## 2024-01-03 DIAGNOSIS — J449 Chronic obstructive pulmonary disease, unspecified: Secondary | ICD-10-CM | POA: Diagnosis not present

## 2024-01-03 DIAGNOSIS — I2699 Other pulmonary embolism without acute cor pulmonale: Secondary | ICD-10-CM | POA: Diagnosis not present

## 2024-01-03 DIAGNOSIS — R4701 Aphasia: Secondary | ICD-10-CM | POA: Diagnosis not present

## 2024-01-03 DIAGNOSIS — G935 Compression of brain: Secondary | ICD-10-CM | POA: Diagnosis not present

## 2024-01-03 DIAGNOSIS — J439 Emphysema, unspecified: Secondary | ICD-10-CM | POA: Diagnosis not present

## 2024-01-03 DIAGNOSIS — Z4682 Encounter for fitting and adjustment of non-vascular catheter: Secondary | ICD-10-CM | POA: Diagnosis not present

## 2024-01-03 DIAGNOSIS — R531 Weakness: Secondary | ICD-10-CM | POA: Diagnosis not present

## 2024-01-03 DIAGNOSIS — R509 Fever, unspecified: Secondary | ICD-10-CM | POA: Diagnosis not present

## 2024-01-03 DIAGNOSIS — F32A Depression, unspecified: Secondary | ICD-10-CM | POA: Diagnosis present

## 2024-01-03 DIAGNOSIS — T17918A Gastric contents in respiratory tract, part unspecified causing other injury, initial encounter: Secondary | ICD-10-CM | POA: Diagnosis not present

## 2024-01-03 DIAGNOSIS — A419 Sepsis, unspecified organism: Secondary | ICD-10-CM | POA: Diagnosis not present

## 2024-01-03 DIAGNOSIS — G936 Cerebral edema: Secondary | ICD-10-CM | POA: Diagnosis not present

## 2024-01-03 DIAGNOSIS — J438 Other emphysema: Secondary | ICD-10-CM | POA: Diagnosis present

## 2024-01-03 DIAGNOSIS — Z881 Allergy status to other antibiotic agents status: Secondary | ICD-10-CM | POA: Diagnosis not present

## 2024-01-03 DIAGNOSIS — E785 Hyperlipidemia, unspecified: Secondary | ICD-10-CM | POA: Diagnosis not present

## 2024-01-03 DIAGNOSIS — R1310 Dysphagia, unspecified: Secondary | ICD-10-CM | POA: Diagnosis not present

## 2024-01-03 DIAGNOSIS — I2510 Atherosclerotic heart disease of native coronary artery without angina pectoris: Secondary | ICD-10-CM | POA: Diagnosis not present

## 2024-01-03 DIAGNOSIS — R5381 Other malaise: Secondary | ICD-10-CM | POA: Diagnosis not present

## 2024-01-03 DIAGNOSIS — I69098 Other sequelae following nontraumatic subarachnoid hemorrhage: Secondary | ICD-10-CM | POA: Diagnosis not present

## 2024-01-03 DIAGNOSIS — R918 Other nonspecific abnormal finding of lung field: Secondary | ICD-10-CM | POA: Diagnosis not present

## 2024-01-03 DIAGNOSIS — I69114 Frontal lobe and executive function deficit following nontraumatic intracerebral hemorrhage: Secondary | ICD-10-CM | POA: Diagnosis not present

## 2024-01-03 DIAGNOSIS — J189 Pneumonia, unspecified organism: Secondary | ICD-10-CM | POA: Diagnosis not present

## 2024-01-03 DIAGNOSIS — I619 Nontraumatic intracerebral hemorrhage, unspecified: Secondary | ICD-10-CM | POA: Diagnosis not present

## 2024-01-03 DIAGNOSIS — R739 Hyperglycemia, unspecified: Secondary | ICD-10-CM | POA: Diagnosis not present

## 2024-01-03 DIAGNOSIS — S066XAA Traumatic subarachnoid hemorrhage with loss of consciousness status unknown, initial encounter: Secondary | ICD-10-CM | POA: Diagnosis not present

## 2024-01-03 DIAGNOSIS — R4189 Other symptoms and signs involving cognitive functions and awareness: Secondary | ICD-10-CM | POA: Diagnosis not present

## 2024-01-03 DIAGNOSIS — Z4659 Encounter for fitting and adjustment of other gastrointestinal appliance and device: Secondary | ICD-10-CM | POA: Diagnosis not present

## 2024-01-03 DIAGNOSIS — I10 Essential (primary) hypertension: Secondary | ICD-10-CM | POA: Diagnosis not present

## 2024-01-03 DIAGNOSIS — R404 Transient alteration of awareness: Secondary | ICD-10-CM | POA: Diagnosis not present

## 2024-01-03 DIAGNOSIS — G919 Hydrocephalus, unspecified: Secondary | ICD-10-CM | POA: Diagnosis present

## 2024-01-03 DIAGNOSIS — R197 Diarrhea, unspecified: Secondary | ICD-10-CM | POA: Diagnosis not present

## 2024-01-03 DIAGNOSIS — R52 Pain, unspecified: Secondary | ICD-10-CM | POA: Diagnosis not present

## 2024-01-03 DIAGNOSIS — I6200 Nontraumatic subdural hemorrhage, unspecified: Secondary | ICD-10-CM | POA: Diagnosis not present

## 2024-01-03 DIAGNOSIS — Z0389 Encounter for observation for other suspected diseases and conditions ruled out: Secondary | ICD-10-CM | POA: Diagnosis not present

## 2024-01-03 DIAGNOSIS — Z01818 Encounter for other preprocedural examination: Secondary | ICD-10-CM | POA: Diagnosis not present

## 2024-01-03 DIAGNOSIS — E7220 Disorder of urea cycle metabolism, unspecified: Secondary | ICD-10-CM | POA: Diagnosis not present

## 2024-01-03 DIAGNOSIS — I612 Nontraumatic intracerebral hemorrhage in hemisphere, unspecified: Secondary | ICD-10-CM | POA: Diagnosis not present

## 2024-01-03 DIAGNOSIS — J432 Centrilobular emphysema: Secondary | ICD-10-CM | POA: Diagnosis present

## 2024-01-03 DIAGNOSIS — Z515 Encounter for palliative care: Secondary | ICD-10-CM | POA: Diagnosis not present

## 2024-01-03 DIAGNOSIS — G9089 Other disorders of autonomic nervous system: Secondary | ICD-10-CM | POA: Diagnosis not present

## 2024-01-03 DIAGNOSIS — G8194 Hemiplegia, unspecified affecting left nondominant side: Secondary | ICD-10-CM | POA: Diagnosis not present

## 2024-01-03 DIAGNOSIS — I6201 Nontraumatic acute subdural hemorrhage: Secondary | ICD-10-CM | POA: Diagnosis present

## 2024-01-03 DIAGNOSIS — R402422 Glasgow coma scale score 9-12, at arrival to emergency department: Secondary | ICD-10-CM | POA: Diagnosis not present

## 2024-01-03 DIAGNOSIS — G9349 Other encephalopathy: Secondary | ICD-10-CM | POA: Diagnosis present

## 2024-01-03 DIAGNOSIS — S065XAA Traumatic subdural hemorrhage with loss of consciousness status unknown, initial encounter: Secondary | ICD-10-CM | POA: Diagnosis not present

## 2024-01-03 DIAGNOSIS — S0635AA Traumatic hemorrhage of left cerebrum with loss of consciousness status unknown, initial encounter: Secondary | ICD-10-CM | POA: Diagnosis not present

## 2024-01-03 DIAGNOSIS — G9389 Other specified disorders of brain: Secondary | ICD-10-CM | POA: Diagnosis present

## 2024-01-03 DIAGNOSIS — E876 Hypokalemia: Secondary | ICD-10-CM | POA: Diagnosis not present

## 2024-01-03 DIAGNOSIS — R29718 NIHSS score 18: Secondary | ICD-10-CM | POA: Diagnosis not present

## 2024-01-03 DIAGNOSIS — I609 Nontraumatic subarachnoid hemorrhage, unspecified: Secondary | ICD-10-CM | POA: Diagnosis not present

## 2024-01-03 DIAGNOSIS — Z86711 Personal history of pulmonary embolism: Secondary | ICD-10-CM | POA: Diagnosis not present

## 2024-01-03 DIAGNOSIS — E870 Hyperosmolality and hypernatremia: Secondary | ICD-10-CM | POA: Diagnosis not present

## 2024-01-03 DIAGNOSIS — R0689 Other abnormalities of breathing: Secondary | ICD-10-CM | POA: Diagnosis not present

## 2024-01-03 DIAGNOSIS — I69298 Other sequelae of other nontraumatic intracranial hemorrhage: Secondary | ICD-10-CM | POA: Diagnosis not present

## 2024-01-03 DIAGNOSIS — Z66 Do not resuscitate: Secondary | ICD-10-CM | POA: Diagnosis not present

## 2024-01-03 DIAGNOSIS — I610 Nontraumatic intracerebral hemorrhage in hemisphere, subcortical: Secondary | ICD-10-CM | POA: Diagnosis not present

## 2024-01-03 DIAGNOSIS — I629 Nontraumatic intracranial hemorrhage, unspecified: Secondary | ICD-10-CM | POA: Diagnosis not present

## 2024-01-03 DIAGNOSIS — F419 Anxiety disorder, unspecified: Secondary | ICD-10-CM | POA: Diagnosis not present

## 2024-01-03 DIAGNOSIS — J690 Pneumonitis due to inhalation of food and vomit: Secondary | ICD-10-CM | POA: Diagnosis not present

## 2024-01-03 DIAGNOSIS — K219 Gastro-esophageal reflux disease without esophagitis: Secondary | ICD-10-CM | POA: Diagnosis not present

## 2024-01-03 DIAGNOSIS — R410 Disorientation, unspecified: Secondary | ICD-10-CM | POA: Diagnosis not present

## 2024-01-03 DIAGNOSIS — I608 Other nontraumatic subarachnoid hemorrhage: Secondary | ICD-10-CM | POA: Diagnosis not present

## 2024-01-03 DIAGNOSIS — I615 Nontraumatic intracerebral hemorrhage, intraventricular: Secondary | ICD-10-CM | POA: Diagnosis present

## 2024-01-03 DIAGNOSIS — G8191 Hemiplegia, unspecified affecting right dominant side: Secondary | ICD-10-CM | POA: Diagnosis not present

## 2024-01-03 NOTE — Progress Notes (Incomplete)
 HEMATOLOGY/ONCOLOGY PHONE VISIT NOTE  Date of Service: 01/04/2024  Patient Care Team: Windell Hasty, DO as PCP - General (Internal Medicine) Loyde Rule, MD as PCP - Cardiology (Cardiology) Diamond Formica, MD as Consulting Physician (Pulmonary Disease)  CHIEF COMPLAINTS/PURPOSE OF CONSULTATION:  Pulmonary Embolism and Anemia  HISTORY OF PRESENTING ILLNESS:   Clayton Braun is a wonderful 73 y.o. male who has been referred to us  by NP Bertha Broad for evaluation and management of Pulmonary Embolism and Anemia. Currently taking Eliquis.   Patient is accompanied by her wife during this visit. Patient notes he had CT scan, X-Ray, and US  bilateral leg which showed Pulmonary Embolism in [place], [place], around one month ago. We do not have the results and will contact PCP.   Patient notes that his symptoms when he was diagnosed with PE were lower chest pain when laying in bed which radiated to his back and SOB. He did not have bilateral leg swelling at the time of PE. He denies multiple clots.   Patient notes he quit smoking cigarettes in January 2025. He was smoking around one pack a day.   He denies any recent long-distance travel. However, patient's wife notes that the patient used to seat on his chair for a long-period of time right before having PE.   Patient notes he had COPD exacerbation around Mid-January. He was diagnosed with COPD in 2024. He was prescribed Prednisone due to COPD exacerbation and was taking Prednisone 25 mg daily for 2-3 weeks. He discontinued Prednisone around 1-2 weeks before getting diagnosed with PE. Patient notes he was depressed when he was taking Prednisone. He denies being depressed or anxious during this visit.   He denies any recent surgeries or recent lower or upper extremity injuries. He has had surgeries in the past, which did not cause blood clots.   He denies FmHx of hereditary causes of blood clots.   He denies any new infection issues,  fever, chills, night sweats, unexpected weight loss, back pain, chest pain, abdominal pain. During this visit, he does complain of bilateral leg swelling, mostly on the left leg.   During this visit, he denies chest pain and notes th  Patient was taking aspirin  at the time of PE.   He denies diet changes or appetite loss.   He denies recent COVID-19 infection.   Patient has PmHx of psoriasis. He has been receiving ilumya injection every 3-months. He started Ilumya treatment around 6-8 months before his PE.   INTERVAL HISTORY:  Clayton Braun is a 73 y.o. male who is being connected with via telemedicine visit for evaluation and management of PE and anemia.  Patient was initially seen by me on 12/11/2023 and reported that his symptoms during his PE diagnosis included lower chest pain when lying down radiating to the back, and SOB.   I connected with BONHAM ZINGALE on 01/04/2024 at  8:40 AM EDT by telephone visit and verified that I am speaking with the correct person using two identifiers.   I discussed the limitations, risks, security and privacy concerns of performing an evaluation and management service by telemedicine and the availability of in-person appointments. I also discussed with the patient that there may be a patient responsible charge related to this service. The patient expressed understanding and agreed to proceed.   Other persons participating in the visit and their role in the encounter: none   Patient's location: home  Provider's location: [facility]   Chief Complaint:  PE and anemia    Today,  The results of his lab workup from 12/11/2023 was discussed with him in detail.  -Discussed lab results from 12/11/2023 in detail with patient. CBC showed WBC of 6.6K, hemoglobin of 12.7, and platelets of 177K. -  MEDICAL HISTORY:  Past Medical History:  Diagnosis Date   Abdominal pain    Acute respiratory disease    Allergic rhinitis    Allergies    Anxiety    09/09/18- not in  a long time   AP (abdominal pain)    Blood in stool    Body mass index 27.0-27.9, adult    Bradycardia, sinus    Bronchitis    Candidiasis    ORAL   Chest pain    March, 2013   Coronary artery disease    Stent to RCA, 2002   /    cath 2009, stent patent, residual 50% LAD   Depression    09/09/2018- not in a while   Diverticulosis 2006   COLONOSCOPY DR. Florette Hurry, H/O POLYP   Dizziness    occasionaly   Dyslipidemia    Fatigue    Gastroenteritis and colitis, viral    GERD (gastroesophageal reflux disease)    Hypercholesterolemia    Hyperkalemia    Per the patient, 2013   Hyperlipidemia    Hypertension    Hypertriglyceridemia    Nummular eczema    Osteoarthritis    Pharyngitis    Pityriasis rosea    Pneumonia    Psoriasis    Rash    Rhinosinusitis    Sinusitis    Skin neoplasm    Tinea cruris    Wart     SURGICAL HISTORY: Past Surgical History:  Procedure Laterality Date   APPENDECTOMY     CARDIAC CATHETERIZATION     2002   COLONOSCOPY     ESOPHAGOGASTRODUODENOSCOPY     EYE SURGERY     implants, cataracts   HERNIA REPAIR Right    Inguinal   LEFT HEART CATH AND CORONARY ANGIOGRAPHY N/A 01/05/2020   Procedure: LEFT HEART CATH AND CORONARY ANGIOGRAPHY;  Surgeon: Odie Benne, MD;  Location: [facility] IN[facility]AS[facility];  Service: Cardiovascular;  Laterality: N/A;   LUMBAR LAMINECTOMY/DECOMPRESSION MICRODISCECTOMY Left 03/22/2018   Procedure: Left Lumbar four-five Microdiscectomy;  Surgeon: Manya Sells, MD;  Location: [facility] OR;  Service: Neurosurgery;  Laterality: Left;   LUMBAR LAMINECTOMY/DECOMPRESSION MICRODISCECTOMY Left 09/10/2018   Procedure: Redo Left Lumbar four-five Microdiscectomy;  Surgeon: Manya Sells, MD;  Location: [facility] OR;  Service: Neurosurgery;  Laterality: Left;   NASAL SINUS SURGERY     PENILE PROSTHESIS PLACEMENT      SOCIAL HISTORY: Social History   Socioeconomic History   Marital status: Married    Spouse name: Not on file   Number of  children: Not on file   Years of education: Not on file   Highest education level: Not on file  Occupational History   Occupation: Full time    Employer: [employer]  Tobacco Use   Smoking status: Every Day    Current packs/day: 0.75    Average packs/day: 0.8 packs/day for 48.0 years (36.0 ttl pk-yrs)    Types: Cigarettes   Smokeless tobacco: Never  Vaping Use   Vaping status: Never Used  Substance and Sexual Activity   Alcohol  use: Not Currently   Drug use: No   Sexual activity: Not Currently  Other Topics Concern   Not on file  Social History Narrative  No regular exercise.   Social Drivers of Corporate investment banker Strain: Not on file  Food Insecurity: Not on file  Transportation Needs: Not on file  Physical Activity: Not on file  Stress: Not on file  Social Connections: Not on file  Intimate Partner Violence: Not on file    FAMILY HISTORY: Family History  Problem Relation Age of Onset   Lung disease Other    Heart disease Other    Heart attack Mother    COPD Father     ALLERGIES:  is allergic to niacin and related.  MEDICATIONS:  Current Outpatient Medications  Medication Sig Dispense Refill   albuterol  (VENTOLIN  HFA) 108 (90 Base) MCG/ACT inhaler Inhale into the lungs. (Patient not taking: Reported on 12/18/2023)     ALPRAZolam  (XANAX ) 1 MG tablet Take 1 mg by mouth at bedtime.  2   apixaban (ELIQUIS) 5 MG TABS tablet Take 5 mg by mouth 2 (two) times daily.     aspirin  EC 81 MG tablet Take 81 mg by mouth daily. (Patient not taking: Reported on 12/18/2023)     atenolol  (TENORMIN ) 25 MG tablet Take 25 mg by mouth daily.     atorvastatin  (LIPITOR) 10 MG tablet Take 10 mg by mouth at bedtime.       FEROSUL 325 (65 Fe) MG tablet Take 325 mg by mouth daily. (Patient not taking: Reported on 12/18/2023)     fluticasone furoate-vilanterol (BREO ELLIPTA) 100-25 MCG/ACT AEPB Inhale 1 puff into the lungs daily.     FOLBIC 2.5-25-2 MG TABS Take 1 tablet by mouth  Daily. (Patient not taking: Reported on 12/18/2023)     gemfibrozil  (LOPID ) 600 MG tablet Take 600 mg by mouth 2 (two) times daily.       levETIRAcetam  (KEPPRA  XR) 500 MG 24 hr tablet Take 1 tablet (500 mg total) by mouth daily. (Patient not taking: Reported on 12/18/2023) 30 tablet 3   montelukast  (SINGULAIR ) 10 MG tablet Take 10 mg by mouth at bedtime.     Multiple Vitamin (MULTIVITAMIN WITH MINERALS) TABS tablet Take 1 tablet by mouth daily. Centrum Silver for Men 50+     nabumetone  (RELAFEN ) 500 MG tablet Take 500 mg by mouth 2 (two) times daily as needed. (Patient not taking: Reported on 12/18/2023)     pantoprazole  (PROTONIX ) 40 MG tablet Take 40 mg by mouth at bedtime.      PARoxetine  (PAXIL ) 40 MG tablet Take 40 mg by mouth at bedtime.      Polyethyl Glycol-Propyl Glycol (SYSTANE ULTRA OP) Place 1 drop into both eyes daily as needed (dry eyes). (Patient not taking: Reported on 12/18/2023)     predniSONE (DELTASONE) 20 MG tablet Take by mouth.     ramipril  (ALTACE ) 2.5 MG capsule Take 2.5 mg by mouth daily.     topiramate  (TOPAMAX ) 25 MG tablet Take 1 tablet (25 mg total) by mouth 2 (two) times daily. 120 tablet 3   traZODone (DESYREL) 50 MG tablet Take 50 mg by mouth at bedtime. (Patient not taking: Reported on 12/18/2023)     triamcinolone  cream (KENALOG ) 0.1 % Apply 1 Application topically 3 (three) times daily. (Patient not taking: Reported on 12/18/2023)     No current facility-administered medications for this visit.    REVIEW OF SYSTEMS:    10 Point review of Systems was done is negative except as noted above.   PHYSICAL EXAMINATION: TELEMEDICINE VISIT ECOG PERFORMANCE STATUS: 1 - Symptomatic but completely ambulatory  . There were  no vitals filed for this visit.  There were no vitals filed for this visit.  .There is no height or weight on file to calculate BMI.   LABORATORY DATA:  I have reviewed the data as listed  .    Latest Ref Rng & Units 12/11/2023   12:59 PM 12/30/2019    10:33 AM 09/07/2018    2:48 PM  CBC  WBC 4.0 - 10.5 K/uL 6.6  6.9  8.8   Hemoglobin 13.0 - 17.0 g/dL 16.1  09.6  04.5   Hematocrit 39.0 - 52.0 % 38.1  42.9  43.0   Platelets 150 - 400 K/uL 177  205  221     .    Latest Ref Rng & Units 12/11/2023   12:59 PM 12/30/2019   10:33 AM 09/07/2018    2:48 PM  CMP  Glucose 70 - 99 mg/dL 97  409  83   BUN 8 - 23 mg/dL 10  18  23    Creatinine 0.61 - 1.24 mg/dL 8.11  9.14  7.82   Sodium 135 - 145 mmol/L 142  135  137   Potassium 3.5 - 5.1 mmol/L 3.5  4.1  4.1   Chloride 98 - 111 mmol/L 106  102  105   CO2 22 - 32 mmol/L 31  29  24    Calcium  8.9 - 10.3 mg/dL 9.5  95.6  9.9   Total Protein 6.5 - 8.1 g/dL 6.9     Total Bilirubin 0.0 - 1.2 mg/dL 0.5     Alkaline Phos 38 - 126 U/L 40     AST 15 - 41 U/L 14     ALT 0 - 44 U/L 11        RADIOGRAPHIC STUDIES: I have personally reviewed the radiological images as listed and agreed with the findings in the report. No results found.  ASSESSMENT & PLAN:  COPD Pulmonary Embolism. Currently taking Eliquis.  Had CT scan, X-Ray, and US  of bilateral lower extremities in [place], [place]. Waiting on results since we do not have access to them.   PLAN: -Discussed with the patient that smoking cigarettes and immobility are risk factors for blood clots.  -Discussed with the patient the difference between provoked and unprovoked PE. Discussed the different treatment for pt with unprovoked vs provoked PE.  -Discussed with the patient that the next plan is to get additional lab work-up to check if this was a provoked or unprovoked PE.  -Continue Eliquis for now.  -Eliquis duration will depend upon lab results. If it is provoked PE, Eliquis will be 41-months. If it is unprovoked PE, Eliquis will most-likely be long-term.  -Recommend to use compression socks.  -Answered all of patient's questions.  -Discussed with the patient that certain medications can increase the risk of blood clots. Discussed that there  is possibility that his Ilumya treatment for Psoriasis could be an additional risk factor for blood clots.  -Recommend to get in-touch with his Dermatologist to discuss the risk factors of Ilumya treatment and let them know about new PE.  -Answered all of patient's questions.   . No orders of the defined types were placed in this encounter.   FOLLOW-UP: ***  The total time spent in the appointment was *** minutes* .  All of the patient's questions were answered with apparent satisfaction. The patient knows to call the clinic with any problems, questions or concerns.   Jacquelyn Matt MD MS AAHIVMS [facility] Hematology/Oncology Physician [facility]  .*Total Encounter Time as defined by the Centers for Medicare and Medicaid Services includes, in addition to the face-to-face time of a patient visit (documented in the note above) non-face-to-face time: obtaining and reviewing outside history, ordering and reviewing medications, tests or procedures, care coordination (communications with other health care professionals or caregivers) and documentation in the medical record.    I,Mitra Faeizi,acting as a Neurosurgeon for Jacquelyn Matt, MD.,have documented all relevant documentation on the behalf of Jacquelyn Matt, MD,as directed by  Jacquelyn Matt, MD while in the presence of Jacquelyn Matt, MD.   ***

## 2024-01-04 ENCOUNTER — Inpatient Hospital Stay: Admitting: Hematology

## 2024-01-04 DIAGNOSIS — I10 Essential (primary) hypertension: Secondary | ICD-10-CM | POA: Diagnosis present

## 2024-01-04 DIAGNOSIS — S0635AA Traumatic hemorrhage of left cerebrum with loss of consciousness status unknown, initial encounter: Secondary | ICD-10-CM | POA: Diagnosis not present

## 2024-01-04 DIAGNOSIS — R1310 Dysphagia, unspecified: Secondary | ICD-10-CM | POA: Diagnosis present

## 2024-01-04 DIAGNOSIS — I609 Nontraumatic subarachnoid hemorrhage, unspecified: Secondary | ICD-10-CM | POA: Diagnosis present

## 2024-01-04 DIAGNOSIS — K219 Gastro-esophageal reflux disease without esophagitis: Secondary | ICD-10-CM | POA: Diagnosis present

## 2024-01-04 DIAGNOSIS — I610 Nontraumatic intracerebral hemorrhage in hemisphere, subcortical: Secondary | ICD-10-CM | POA: Diagnosis not present

## 2024-01-04 DIAGNOSIS — J438 Other emphysema: Secondary | ICD-10-CM | POA: Diagnosis present

## 2024-01-04 DIAGNOSIS — R4189 Other symptoms and signs involving cognitive functions and awareness: Secondary | ICD-10-CM | POA: Diagnosis not present

## 2024-01-04 DIAGNOSIS — Z515 Encounter for palliative care: Secondary | ICD-10-CM | POA: Diagnosis not present

## 2024-01-04 DIAGNOSIS — Z4659 Encounter for fitting and adjustment of other gastrointestinal appliance and device: Secondary | ICD-10-CM | POA: Diagnosis not present

## 2024-01-04 DIAGNOSIS — I6789 Other cerebrovascular disease: Secondary | ICD-10-CM | POA: Diagnosis not present

## 2024-01-04 DIAGNOSIS — R0689 Other abnormalities of breathing: Secondary | ICD-10-CM | POA: Diagnosis not present

## 2024-01-04 DIAGNOSIS — E876 Hypokalemia: Secondary | ICD-10-CM | POA: Diagnosis not present

## 2024-01-04 DIAGNOSIS — J690 Pneumonitis due to inhalation of food and vomit: Secondary | ICD-10-CM | POA: Diagnosis not present

## 2024-01-04 DIAGNOSIS — I6201 Nontraumatic acute subdural hemorrhage: Secondary | ICD-10-CM | POA: Diagnosis present

## 2024-01-04 DIAGNOSIS — R918 Other nonspecific abnormal finding of lung field: Secondary | ICD-10-CM | POA: Diagnosis not present

## 2024-01-04 DIAGNOSIS — Z01818 Encounter for other preprocedural examination: Secondary | ICD-10-CM | POA: Diagnosis not present

## 2024-01-04 DIAGNOSIS — J449 Chronic obstructive pulmonary disease, unspecified: Secondary | ICD-10-CM | POA: Diagnosis not present

## 2024-01-04 DIAGNOSIS — Z4682 Encounter for fitting and adjustment of non-vascular catheter: Secondary | ICD-10-CM | POA: Diagnosis not present

## 2024-01-04 DIAGNOSIS — G919 Hydrocephalus, unspecified: Secondary | ICD-10-CM | POA: Diagnosis present

## 2024-01-04 DIAGNOSIS — I612 Nontraumatic intracerebral hemorrhage in hemisphere, unspecified: Secondary | ICD-10-CM | POA: Diagnosis not present

## 2024-01-04 DIAGNOSIS — G9089 Other disorders of autonomic nervous system: Secondary | ICD-10-CM | POA: Diagnosis not present

## 2024-01-04 DIAGNOSIS — Z86711 Personal history of pulmonary embolism: Secondary | ICD-10-CM | POA: Diagnosis not present

## 2024-01-04 DIAGNOSIS — F32A Depression, unspecified: Secondary | ICD-10-CM | POA: Diagnosis present

## 2024-01-04 DIAGNOSIS — I69114 Frontal lobe and executive function deficit following nontraumatic intracerebral hemorrhage: Secondary | ICD-10-CM | POA: Diagnosis not present

## 2024-01-04 DIAGNOSIS — E785 Hyperlipidemia, unspecified: Secondary | ICD-10-CM | POA: Diagnosis present

## 2024-01-04 DIAGNOSIS — S065XAA Traumatic subdural hemorrhage with loss of consciousness status unknown, initial encounter: Secondary | ICD-10-CM | POA: Diagnosis not present

## 2024-01-04 DIAGNOSIS — Z66 Do not resuscitate: Secondary | ICD-10-CM | POA: Diagnosis not present

## 2024-01-04 DIAGNOSIS — S066XAA Traumatic subarachnoid hemorrhage with loss of consciousness status unknown, initial encounter: Secondary | ICD-10-CM | POA: Diagnosis not present

## 2024-01-04 DIAGNOSIS — I6200 Nontraumatic subdural hemorrhage, unspecified: Secondary | ICD-10-CM | POA: Diagnosis not present

## 2024-01-04 DIAGNOSIS — R509 Fever, unspecified: Secondary | ICD-10-CM | POA: Diagnosis not present

## 2024-01-04 DIAGNOSIS — J189 Pneumonia, unspecified organism: Secondary | ICD-10-CM | POA: Diagnosis not present

## 2024-01-04 DIAGNOSIS — R52 Pain, unspecified: Secondary | ICD-10-CM | POA: Diagnosis not present

## 2024-01-04 DIAGNOSIS — E870 Hyperosmolality and hypernatremia: Secondary | ICD-10-CM | POA: Diagnosis not present

## 2024-01-04 DIAGNOSIS — I619 Nontraumatic intracerebral hemorrhage, unspecified: Secondary | ICD-10-CM | POA: Diagnosis not present

## 2024-01-04 DIAGNOSIS — E7220 Disorder of urea cycle metabolism, unspecified: Secondary | ICD-10-CM | POA: Diagnosis not present

## 2024-01-04 DIAGNOSIS — I2699 Other pulmonary embolism without acute cor pulmonale: Secondary | ICD-10-CM | POA: Diagnosis not present

## 2024-01-04 DIAGNOSIS — G9389 Other specified disorders of brain: Secondary | ICD-10-CM | POA: Diagnosis present

## 2024-01-04 DIAGNOSIS — G8191 Hemiplegia, unspecified affecting right dominant side: Secondary | ICD-10-CM | POA: Diagnosis present

## 2024-01-04 DIAGNOSIS — Z0389 Encounter for observation for other suspected diseases and conditions ruled out: Secondary | ICD-10-CM | POA: Diagnosis not present

## 2024-01-04 DIAGNOSIS — I69098 Other sequelae following nontraumatic subarachnoid hemorrhage: Secondary | ICD-10-CM | POA: Diagnosis not present

## 2024-01-04 DIAGNOSIS — I618 Other nontraumatic intracerebral hemorrhage: Secondary | ICD-10-CM | POA: Diagnosis not present

## 2024-01-04 DIAGNOSIS — G9349 Other encephalopathy: Secondary | ICD-10-CM | POA: Diagnosis present

## 2024-01-04 DIAGNOSIS — I615 Nontraumatic intracerebral hemorrhage, intraventricular: Secondary | ICD-10-CM | POA: Diagnosis present

## 2024-01-04 DIAGNOSIS — G935 Compression of brain: Secondary | ICD-10-CM | POA: Diagnosis not present

## 2024-01-04 DIAGNOSIS — I608 Other nontraumatic subarachnoid hemorrhage: Secondary | ICD-10-CM | POA: Diagnosis not present

## 2024-01-04 DIAGNOSIS — R410 Disorientation, unspecified: Secondary | ICD-10-CM | POA: Diagnosis not present

## 2024-01-04 DIAGNOSIS — T17918A Gastric contents in respiratory tract, part unspecified causing other injury, initial encounter: Secondary | ICD-10-CM | POA: Diagnosis not present

## 2024-01-04 DIAGNOSIS — I69298 Other sequelae of other nontraumatic intracranial hemorrhage: Secondary | ICD-10-CM | POA: Diagnosis not present

## 2024-01-04 DIAGNOSIS — I629 Nontraumatic intracranial hemorrhage, unspecified: Secondary | ICD-10-CM | POA: Diagnosis not present

## 2024-01-04 DIAGNOSIS — R4701 Aphasia: Secondary | ICD-10-CM | POA: Diagnosis present

## 2024-01-04 DIAGNOSIS — R197 Diarrhea, unspecified: Secondary | ICD-10-CM | POA: Diagnosis not present

## 2024-01-04 DIAGNOSIS — F419 Anxiety disorder, unspecified: Secondary | ICD-10-CM | POA: Diagnosis not present

## 2024-01-04 DIAGNOSIS — I2510 Atherosclerotic heart disease of native coronary artery without angina pectoris: Secondary | ICD-10-CM | POA: Diagnosis not present

## 2024-01-04 DIAGNOSIS — J432 Centrilobular emphysema: Secondary | ICD-10-CM | POA: Diagnosis present

## 2024-01-04 DIAGNOSIS — R739 Hyperglycemia, unspecified: Secondary | ICD-10-CM | POA: Diagnosis not present

## 2024-01-04 DIAGNOSIS — G936 Cerebral edema: Secondary | ICD-10-CM | POA: Diagnosis not present

## 2024-01-04 DIAGNOSIS — J439 Emphysema, unspecified: Secondary | ICD-10-CM | POA: Diagnosis not present

## 2024-02-10 DEATH — deceased

## 2024-03-24 NOTE — Progress Notes (Signed)
 This encounter was created in error - please disregard.

## 2024-03-26 ENCOUNTER — Encounter: Payer: Self-pay | Admitting: Internal Medicine

## 2024-03-26 ENCOUNTER — Ambulatory Visit: Admitting: Internal Medicine

## 2024-03-26 NOTE — Progress Notes (Deleted)
 Clayton Braun, male    DOB: [birth year]    MRN: [id number]   Brief patient profile:  54  yowm  quit smoking jan 2025  referred to pulmonary clinic in [place]  12/18/2023 by [facility]  for copd / pe dx [place] 11/2023    [place] eval 06/2022 for  ? COPD  rec pfts not done as of 12/18/2023      History of Present Illness  12/18/2023  Pulmonary/ 1st office eval/ Jeanette Moffatt / [place] [facility]  Chief Complaint  Patient presents with   Establish Care  Dyspnea:  held back more by leg/back than breathing. Cough: none  Sleep: ok flat bed one pillow  SABA use: none - uses breo prn  02: none  Rec If you want to use the BREO it should be a least an hour before activity and should last all day (take every other day to see if you can tell the difference - try not to take it after your exert and side down because your breathing will improve just from sitting down.   03/26/2024  f/u ov/[place] office/Mieke Brinley re: doe  maint on ***  No chief complaint on file.   Dyspnea:  *** Cough: *** Sleeping: ***   resp cc  SABA use: *** 02: ***  Lung cancer screening: ***   No obvious day to day or daytime variability or assoc excess/ purulent sputum or mucus plugs or hemoptysis or cp or chest tightness, subjective wheeze or overt sinus or hb symptoms.    Also denies any obvious fluctuation of symptoms with weather or environmental changes or other aggravating or alleviating factors except as outlined above   No unusual exposure hx or h/o childhood pna/ asthma or knowledge of premature birth.  Current Allergies, Complete Past Medical History, Past Surgical History, Family History, and Social History were reviewed in [employer] record.  ROS  The following are not active complaints unless bolded Hoarseness, sore throat, dysphagia, dental problems, itching, sneezing,  nasal congestion or discharge of excess mucus or purulent secretions, ear ache,   fever, chills, sweats, unintended wt  loss or wt gain, classically pleuritic or exertional cp,  orthopnea pnd or arm/hand swelling  or leg swelling, presyncope, palpitations, abdominal pain, anorexia, nausea, vomiting, diarrhea  or change in bowel habits or change in bladder habits, change in stools or change in urine, dysuria, hematuria,  rash, arthralgias, visual complaints, headache, numbness, weakness or ataxia or problems with walking or coordination,  change in mood or  memory.        No outpatient medications have been marked as taking for the 03/26/24 encounter (Appointment) with Darlean Ozell NOVAK, MD.            Past Medical History:  Diagnosis Date   Abdominal pain    Acute respiratory disease    Allergic rhinitis    Allergies    Anxiety    09/09/18- not in a long time   AP (abdominal pain)    Blood in stool    Body mass index 27.0-27.9, adult    Bradycardia, sinus    Bronchitis    Candidiasis    ORAL   Chest pain    March, 2013   Coronary artery disease    Stent to RCA, 2002   /    cath 2009, stent patent, residual 50% LAD   Depression    09/09/2018- not in a while   Diverticulosis 2006   COLONOSCOPY DR. BARRY, H/O POLYP  Dizziness    occasionaly   Dyslipidemia    Fatigue    Gastroenteritis and colitis, viral    GERD (gastroesophageal reflux disease)    Hypercholesterolemia    Hyperkalemia    Per the patient, 2013   Hyperlipidemia    Hypertension    Hypertriglyceridemia    Nummular eczema    Osteoarthritis    Pharyngitis    Pityriasis rosea    Pneumonia    Psoriasis    Rash    Rhinosinusitis    Sinusitis    Skin neoplasm    Tinea cruris    Wart       Objective:   Wt Readings from Last 3 Encounters:  12/18/23 167 lb 4 oz (75.9 kg)  12/11/23 167 lb 12.8 oz (76.1 kg)  04/16/23 164 lb 12.8 oz (74.8 kg)     Vital signs reviewed  03/26/2024  - Note at rest 02 sats  ***% on ***   General appearance:    ***    Min bar***      Assessment
